# Patient Record
Sex: Female | Born: 1980 | Race: White | Hispanic: No | Marital: Married
Health system: Southern US, Community
[De-identification: ages and names within clinical notes are randomized; demographics above are authoritative.]

## PROBLEM LIST (undated history)

## (undated) ENCOUNTER — Emergency Department (HOSPITAL_COMMUNITY): Admission: EM | Payer: Managed Care, Other (non HMO) | Source: Home / Self Care

---

## 2019-06-15 ENCOUNTER — Other Ambulatory Visit: Payer: Self-pay

## 2019-06-15 DIAGNOSIS — Z20822 Contact with and (suspected) exposure to covid-19: Secondary | ICD-10-CM

## 2019-06-16 LAB — NOVEL CORONAVIRUS, NAA: SARS-CoV-2, NAA: NOT DETECTED

## 2020-05-11 DIAGNOSIS — F418 Other specified anxiety disorders: Secondary | ICD-10-CM | POA: Diagnosis not present

## 2020-05-11 DIAGNOSIS — E7801 Familial hypercholesterolemia: Secondary | ICD-10-CM | POA: Diagnosis not present

## 2020-07-05 DIAGNOSIS — J029 Acute pharyngitis, unspecified: Secondary | ICD-10-CM | POA: Diagnosis not present

## 2020-09-06 DIAGNOSIS — Z79899 Other long term (current) drug therapy: Secondary | ICD-10-CM | POA: Diagnosis not present

## 2020-09-06 DIAGNOSIS — M10072 Idiopathic gout, left ankle and foot: Secondary | ICD-10-CM | POA: Diagnosis not present

## 2020-09-06 DIAGNOSIS — E7801 Familial hypercholesterolemia: Secondary | ICD-10-CM | POA: Diagnosis not present

## 2020-11-03 DIAGNOSIS — R42 Dizziness and giddiness: Secondary | ICD-10-CM | POA: Diagnosis not present

## 2020-11-15 DIAGNOSIS — E7849 Other hyperlipidemia: Secondary | ICD-10-CM | POA: Diagnosis not present

## 2020-11-15 DIAGNOSIS — F3340 Major depressive disorder, recurrent, in remission, unspecified: Secondary | ICD-10-CM | POA: Diagnosis not present

## 2020-11-15 DIAGNOSIS — J018 Other acute sinusitis: Secondary | ICD-10-CM | POA: Diagnosis not present

## 2020-11-16 ENCOUNTER — Other Ambulatory Visit: Payer: Self-pay | Admitting: Internal Medicine

## 2020-11-16 DIAGNOSIS — E7849 Other hyperlipidemia: Secondary | ICD-10-CM | POA: Diagnosis not present

## 2020-11-16 DIAGNOSIS — Z0000 Encounter for general adult medical examination without abnormal findings: Secondary | ICD-10-CM | POA: Diagnosis not present

## 2020-11-16 DIAGNOSIS — R6889 Other general symptoms and signs: Secondary | ICD-10-CM | POA: Diagnosis not present

## 2020-11-16 DIAGNOSIS — Z13220 Encounter for screening for lipoid disorders: Secondary | ICD-10-CM | POA: Diagnosis not present

## 2020-11-16 DIAGNOSIS — Z131 Encounter for screening for diabetes mellitus: Secondary | ICD-10-CM | POA: Diagnosis not present

## 2020-11-16 DIAGNOSIS — Z20828 Contact with and (suspected) exposure to other viral communicable diseases: Secondary | ICD-10-CM | POA: Diagnosis not present

## 2020-11-16 DIAGNOSIS — Z20822 Contact with and (suspected) exposure to covid-19: Secondary | ICD-10-CM | POA: Diagnosis not present

## 2020-11-16 DIAGNOSIS — J019 Acute sinusitis, unspecified: Secondary | ICD-10-CM | POA: Diagnosis not present

## 2020-11-16 DIAGNOSIS — E559 Vitamin D deficiency, unspecified: Secondary | ICD-10-CM | POA: Diagnosis not present

## 2020-11-21 LAB — COMPLETE METABOLIC PANEL WITH GFR
AG Ratio: 1.9 (calc) (ref 1.0–2.5)
ALT: 17 U/L (ref 6–29)
AST: 19 U/L (ref 10–30)
Albumin: 4.3 g/dL (ref 3.6–5.1)
Alkaline phosphatase (APISO): 50 U/L (ref 31–125)
BUN: 18 mg/dL (ref 7–25)
CO2: 23 mmol/L (ref 20–32)
Calcium: 9.2 mg/dL (ref 8.6–10.2)
Chloride: 104 mmol/L (ref 98–110)
Creat: 0.53 mg/dL (ref 0.50–1.10)
GFR, Est African American: 138 mL/min/{1.73_m2} (ref >=60)
GFR, Est Non African American: 119 mL/min/{1.73_m2} (ref >=60)
Globulin: 2.3 g/dL (calc) (ref 1.9–3.7)
Glucose, Bld: 78 mg/dL (ref 65–99)
Potassium: 4.3 mmol/L (ref 3.5–5.3)
Sodium: 137 mmol/L (ref 135–146)
Total Bilirubin: 0.2 mg/dL (ref 0.2–1.2)
Total Protein: 6.6 g/dL (ref 6.1–8.1)

## 2020-11-21 LAB — CBC
HCT: 32.2 % — ABNORMAL LOW (ref 35.0–45.0)
Hemoglobin: 9.4 g/dL — ABNORMAL LOW (ref 11.7–15.5)
MCH: 20.8 pg — ABNORMAL LOW (ref 27.0–33.0)
MCHC: 29.2 g/dL — ABNORMAL LOW (ref 32.0–36.0)
MCV: 71.2 fL — ABNORMAL LOW (ref 80.0–100.0)
MPV: 10.6 fL (ref 7.5–12.5)
Platelets: 226 10*3/uL (ref 140–400)
RBC: 4.52 10*6/uL (ref 3.80–5.10)
RDW: 18.3 % — ABNORMAL HIGH (ref 11.0–15.0)
WBC: 5 10*3/uL (ref 3.8–10.8)

## 2020-11-21 LAB — LIPID PANEL
Cholesterol: 152 mg/dL (ref <200)
HDL: 59 mg/dL (ref >=50)
LDL Cholesterol (Calc): 73 mg/dL (calc)
Non-HDL Cholesterol (Calc): 93 mg/dL (calc) (ref <130)
Total CHOL/HDL Ratio: 2.6 (calc) (ref <5.0)
Triglycerides: 117 mg/dL (ref <150)

## 2020-11-21 LAB — IRON, TOTAL/TOTAL IRON BINDING CAP
%SAT: 4 % (calc) — ABNORMAL LOW (ref 16–45)
Iron: 18 ug/dL — ABNORMAL LOW (ref 40–190)
TIBC: 445 mcg/dL (calc) (ref 250–450)

## 2020-11-21 LAB — VITAMIN D 25 HYDROXY (VIT D DEFICIENCY, FRACTURES): Vit D, 25-Hydroxy: 61 ng/mL (ref 30–100)

## 2020-11-21 LAB — SARS-COV-2 RNA,(COVID-19) QUALITATIVE NAAT: SARS CoV2 RNA: DETECTED — AB

## 2020-11-21 LAB — B12 AND FOLATE PANEL
Folate: 16.9 ng/mL
Vitamin B-12: 1166 pg/mL — ABNORMAL HIGH (ref 200–1100)

## 2020-11-21 LAB — FERRITIN: Ferritin: 5 ng/mL — ABNORMAL LOW (ref 16–154)

## 2021-02-12 DIAGNOSIS — M654 Radial styloid tenosynovitis [de Quervain]: Secondary | ICD-10-CM | POA: Diagnosis not present

## 2021-02-18 DIAGNOSIS — R1013 Epigastric pain: Secondary | ICD-10-CM | POA: Diagnosis not present

## 2021-03-07 DIAGNOSIS — M654 Radial styloid tenosynovitis [de Quervain]: Secondary | ICD-10-CM | POA: Diagnosis not present

## 2021-04-30 ENCOUNTER — Ambulatory Visit (HOSPITAL_COMMUNITY)
Admission: EM | Admit: 2021-04-30 | Discharge: 2021-04-30 | Disposition: A | Discharge status: Home / Self Care | Payer: BC Managed Care – PPO | Attending: Urology | Admitting: Urology

## 2021-04-30 DIAGNOSIS — F410 Panic disorder [episodic paroxysmal anxiety] without agoraphobia: Secondary | ICD-10-CM | POA: Diagnosis not present

## 2021-04-30 MED ORDER — HYDROXYZINE HCL 10 MG PO TABS: 10.0000 mg | ORAL_TABLET | Freq: Three times a day (TID) | ORAL | 0 refills | Status: AC | PRN

## 2021-04-30 MED ORDER — HYDROXYZINE HCL 25 MG PO TABS
25.0000 mg | ORAL_TABLET | Freq: Once | ORAL | Status: AC
  Administered 2021-04-30: 25 mg via ORAL
  Filled 2021-04-30: qty 1

## 2021-04-30 NOTE — Progress Notes (Signed)
   04/30/21 0212  Patient Reported Information  How Did You Hear About [place]? Family/Friend  What Is the Reason for Your Visit/Call Today? Pt reports, having bad anxiety and is getting worse instead of getting better. Per pt, she was treated for anxiety over 18 years ago and has been okay. Pt reports, she doesn't know why it's coming up now. Pt reports, she can't sleep at night she will have bad dreams wake up and when she goes back to sleep the bad dream continues. Pt reports having 2-3 panic attacks per month. Pt discussed she went to the dentist and was shaking so bad the dentist wanted to stop and give her tempoary filling but she encourage the dentist to continue. Pt reports, she wants to be happy. Pt reports she can contact for safety. Pt's husband reports, he feels the pt will be safe if discharged. Outpatient resources provided.  How Long Has This Been Causing You Problems? 1-6 months  What Do You Feel Would Help You the Most Today? Medication(s) (Anixety.)  Have You Recently Had Any Thoughts About Hurting Yourself? No  Are You Planning to Commit Suicide/Harm Yourself At This time? No  Have you Recently Had Thoughts About Hurting Someone Karolee Ohs? No  Are You Planning To Harm Someone At This Time? No  Have You Used Any Alcohol or Drugs in the Past 24 Hours? No  Do You Currently Have a Therapist/Psychiatrist? No  [facility] Screening Triage Referral Assessment  Type of Contact Face-to-Face  Location of Assessment [facility] Assessment Services  Provider location [facility] Assessment Services  Collateral Involvement Josh Loux, husband, [phone].  Does Patient Have a Automotive engineer Guardian? No  Patient Determined To Be At Risk for Harm To Self or Others Based on Review of Patient Reported Information or Presenting Complaint? No  Does Patient Present under Involuntary Commitment? No  [place] of Residence [place]  Patient Currently Receiving the Following Services: Not Receiving Services   Determination of Need Routine (7 days)  Options For Referral Medication Management;Outpatient Therapy    Determination of need: Routine.    Redmond Pulling, MS, [facility], [facility] Triage Specialist [phone]

## 2021-04-30 NOTE — Discharge Instructions (Signed)

## 2021-04-30 NOTE — ED Provider Notes (Signed)
Behavioral Health Urgent Care Medical Screening Exam  Patient Name: Diane Owens MRN: [id number] Date of Evaluation: 04/30/21 Chief Complaint:   Diagnosis:  Final diagnoses:  Panic disorder    History of Present illness: Diane Owens is a 40 y.o. female with a self-reported psychiatric history of depression and anxiety.  Patient presented voluntarily to [facility] with chief complaint of worsening anxiety and panic attacks.  Patient is accompanied by her husband [name].  Patient consented to her husband remaining present in the room during this assessment.  Patient is assessed face-to-face and her chart was reviewed by this provider.  On approach, patient is tearful, alert, and oriented x4.  She is speaking in normal tone of voice at moderate rate and pace with good eye contact.  Patient reports her mood as "very anxious and panicky."  Patient thought process is coherent.  Patient reports that she has been experiencing worsening anxiety with frequent panic attacks over the past 1 year. She says her symptoms has worsened in the past few weeks and that it is affecting her sleep and her ability to function. She states that she has nightmares and wakes up several times throughout the night. she reports palpitation, chest tightness, and visual troubles during panic attacks that resolves on its own after a panic attack.   She reports that she is currently prescribed Prozac 40 mg/day for anxiety and depression.  She denies depressive symptoms currently.  She is unable to identify triggers for anxiety however she reports that she has a history of trauma.  She is evasive about discussing trauma and reports that she will like to be connected with outpatient psychiatric resources to discuss trauma.   Patient denies SI/HI/paranoia/hallucination/alcohol and illicit substance use.  Patient contracted verbally for safety with this Clinical research associate.  Patient's husband reports that he has no safety concern.   Psychiatric  Specialty Exam  Presentation  General Appearance:Appropriate for Environment  Eye Contact:Good  Speech:Clear and Coherent  Speech Volume:Normal  Handedness:Right   Mood and Affect  Mood:Anxious  Affect:Congruent   Thought Process  Thought Processes:Coherent  Descriptions of Associations:Intact  Orientation:Full (Time, Place and Person)  Thought Content:WDL    Hallucinations:None  Ideas of Reference:None  Suicidal Thoughts:No data recorded Homicidal Thoughts:No   Sensorium  Memory:Immediate Good; Recent Good; Remote Good  Judgment:Fair  Insight:Good   Executive Functions  Concentration:Good  Attention Span:Good  Recall:Good  Fund of Knowledge:Good  Language:Good   Psychomotor Activity  Psychomotor Activity:Normal   Assets  Assets:Communication Skills; Desire for Improvement; Financial Resources/Insurance; Housing; Physical Health; Social Support; Intimacy; Transportation; Vocational/Educational   Sleep  Sleep:Poor  Number of hours: 2   No data recorded  Physical Exam: Physical Exam Vitals and nursing note reviewed.  Constitutional:      General: She is not in acute distress.    Appearance: She is well-developed.  HENT:     Head: Normocephalic and atraumatic.  Eyes:     Conjunctiva/sclera: Conjunctivae normal.  Cardiovascular:     Rate and Rhythm: Normal rate.  Pulmonary:     Effort: Pulmonary effort is normal. No respiratory distress.     Breath sounds: Normal breath sounds.  Abdominal:     Palpations: Abdomen is soft.     Tenderness: There is no abdominal tenderness.  Musculoskeletal:     Cervical back: Neck supple.  Skin:    General: Skin is warm and dry.  Neurological:     Mental Status: She is alert and oriented to person, place, and time.  Review of Systems  Constitutional: Negative.   HENT: Negative.    Eyes: Negative.   Respiratory: Negative.    Cardiovascular: Negative.   Gastrointestinal: Negative.    Genitourinary: Negative.   Musculoskeletal: Negative.   Skin: Negative.   Neurological: Negative.   Endo/Heme/Allergies: Negative.   Psychiatric/Behavioral:  Negative for depression, hallucinations, substance abuse and suicidal ideas. The patient is nervous/anxious.   Blood pressure 98/71, pulse 70, temperature 98.3 F (36.8 C), temperature source Oral, resp. rate 16, SpO2 98 %. There is no height or weight on file to calculate BMI.  Musculoskeletal: Strength & Muscle Tone: within normal limits Gait & Station: normal Patient leans: Right   [facility] MSE Discharge Disposition for Follow up and Recommendations: Based on my evaluation the patient does not appear to have an emergency medical condition and can be discharged with resources and follow up care in outpatient services for Medication Management and Individual Therapy  -New prescription for hydroxyzine 25 mg as needed 3 times daily.  Outpatient psychiatric resources/counseling services provided to the patient. -Patient instructed to call 911 emergency services, return to [facility] or go to nearest ED if symptoms worsens.   Maricela Bo, NP 04/30/2021, 2:16 AM

## 2021-05-01 ENCOUNTER — Telehealth (HOSPITAL_COMMUNITY): Payer: Self-pay | Admitting: Internal Medicine

## 2021-05-01 NOTE — BH Assessment (Signed)
Care Management - Follow Up [facility] Discharges   Writer made contact with the patient. Patient reports that he has a follow up appointment at a mental health facility on 05-24-21.

## 2021-05-03 DIAGNOSIS — F410 Panic disorder [episodic paroxysmal anxiety] without agoraphobia: Secondary | ICD-10-CM | POA: Diagnosis not present

## 2021-05-03 DIAGNOSIS — F419 Anxiety disorder, unspecified: Secondary | ICD-10-CM | POA: Diagnosis not present

## 2021-06-06 DIAGNOSIS — F4310 Post-traumatic stress disorder, unspecified: Secondary | ICD-10-CM | POA: Diagnosis not present

## 2021-06-08 DIAGNOSIS — J101 Influenza due to other identified influenza virus with other respiratory manifestations: Secondary | ICD-10-CM | POA: Diagnosis not present

## 2021-06-08 DIAGNOSIS — A0839 Other viral enteritis: Secondary | ICD-10-CM | POA: Diagnosis not present

## 2021-06-12 DIAGNOSIS — F4310 Post-traumatic stress disorder, unspecified: Secondary | ICD-10-CM | POA: Diagnosis not present

## 2021-06-12 DIAGNOSIS — F332 Major depressive disorder, recurrent severe without psychotic features: Secondary | ICD-10-CM | POA: Diagnosis not present

## 2021-06-12 DIAGNOSIS — F410 Panic disorder [episodic paroxysmal anxiety] without agoraphobia: Secondary | ICD-10-CM | POA: Diagnosis not present

## 2021-06-12 DIAGNOSIS — F4312 Post-traumatic stress disorder, chronic: Secondary | ICD-10-CM | POA: Diagnosis not present

## 2021-06-28 DIAGNOSIS — F4310 Post-traumatic stress disorder, unspecified: Secondary | ICD-10-CM | POA: Diagnosis not present

## 2021-07-11 DIAGNOSIS — F4310 Post-traumatic stress disorder, unspecified: Secondary | ICD-10-CM | POA: Diagnosis not present

## 2021-07-19 DIAGNOSIS — F410 Panic disorder [episodic paroxysmal anxiety] without agoraphobia: Secondary | ICD-10-CM | POA: Diagnosis not present

## 2021-08-08 DIAGNOSIS — F4310 Post-traumatic stress disorder, unspecified: Secondary | ICD-10-CM | POA: Diagnosis not present

## 2021-08-14 DIAGNOSIS — F332 Major depressive disorder, recurrent severe without psychotic features: Secondary | ICD-10-CM | POA: Diagnosis not present

## 2021-08-28 DIAGNOSIS — J029 Acute pharyngitis, unspecified: Secondary | ICD-10-CM | POA: Diagnosis not present

## 2021-09-05 DIAGNOSIS — F4310 Post-traumatic stress disorder, unspecified: Secondary | ICD-10-CM | POA: Diagnosis not present

## 2021-10-03 DIAGNOSIS — F4310 Post-traumatic stress disorder, unspecified: Secondary | ICD-10-CM | POA: Diagnosis not present

## 2021-11-23 ENCOUNTER — Emergency Department (HOSPITAL_COMMUNITY): Payer: Managed Care, Other (non HMO)

## 2021-11-23 ENCOUNTER — Encounter (HOSPITAL_COMMUNITY): Payer: Self-pay

## 2021-11-23 ENCOUNTER — Other Ambulatory Visit: Payer: Self-pay

## 2021-11-23 ENCOUNTER — Emergency Department (HOSPITAL_COMMUNITY)
Admission: EM | Admit: 2021-11-23 | Discharge: 2021-11-23 | Disposition: A | Discharge status: Home / Self Care | Payer: Managed Care, Other (non HMO) | Attending: Emergency Medicine | Admitting: Emergency Medicine

## 2021-11-23 DIAGNOSIS — N9489 Other specified conditions associated with female genital organs and menstrual cycle: Secondary | ICD-10-CM | POA: Diagnosis not present

## 2021-11-23 DIAGNOSIS — D649 Anemia, unspecified: Secondary | ICD-10-CM | POA: Diagnosis not present

## 2021-11-23 DIAGNOSIS — R1084 Generalized abdominal pain: Secondary | ICD-10-CM | POA: Diagnosis present

## 2021-11-23 DIAGNOSIS — K8020 Calculus of gallbladder without cholecystitis without obstruction: Secondary | ICD-10-CM | POA: Diagnosis not present

## 2021-11-23 LAB — CBC WITH DIFFERENTIAL/PLATELET
Abs Immature Granulocytes: 0.01 10*3/uL (ref 0.00–0.07)
Basophils Absolute: 0.1 10*3/uL (ref 0.0–0.1)
Basophils Relative: 1 %
Eosinophils Absolute: 0.2 10*3/uL (ref 0.0–0.5)
Eosinophils Relative: 3 %
HCT: 29.3 % — ABNORMAL LOW (ref 36.0–46.0)
Hemoglobin: 8.2 g/dL — ABNORMAL LOW (ref 12.0–15.0)
Immature Granulocytes: 0 %
Lymphocytes Relative: 28 %
Lymphs Abs: 1.7 10*3/uL (ref 0.7–4.0)
MCH: 19.1 pg — ABNORMAL LOW (ref 26.0–34.0)
MCHC: 28 g/dL — ABNORMAL LOW (ref 30.0–36.0)
MCV: 68.3 fL — ABNORMAL LOW (ref 80.0–100.0)
Monocytes Absolute: 0.5 10*3/uL (ref 0.1–1.0)
Monocytes Relative: 9 %
Neutro Abs: 3.5 10*3/uL (ref 1.7–7.7)
Neutrophils Relative %: 59 %
Platelets: 287 10*3/uL (ref 150–400)
RBC: 4.29 MIL/uL (ref 3.87–5.11)
RDW: 18.4 % — ABNORMAL HIGH (ref 11.5–15.5)
WBC: 5.9 10*3/uL (ref 4.0–10.5)
nRBC: 0 % (ref 0.0–0.2)

## 2021-11-23 LAB — COMPREHENSIVE METABOLIC PANEL
ALT: 15 U/L (ref 0–44)
AST: 15 U/L (ref 15–41)
Albumin: 4.1 g/dL (ref 3.5–5.0)
Alkaline Phosphatase: 42 U/L (ref 38–126)
Anion gap: 6 (ref 5–15)
BUN: 22 mg/dL — ABNORMAL HIGH (ref 6–20)
CO2: 20 mmol/L — ABNORMAL LOW (ref 22–32)
Calcium: 8.9 mg/dL (ref 8.9–10.3)
Chloride: 112 mmol/L — ABNORMAL HIGH (ref 98–111)
Creatinine, Ser: 0.54 mg/dL (ref 0.44–1.00)
GFR, Estimated: >60 mL/min (ref >60)
Glucose, Bld: 95 mg/dL (ref 70–99)
Potassium: 4.3 mmol/L (ref 3.5–5.1)
Sodium: 138 mmol/L (ref 135–145)
Total Bilirubin: 0.4 mg/dL (ref 0.3–1.2)
Total Protein: 7.2 g/dL (ref 6.5–8.1)

## 2021-11-23 LAB — I-STAT BETA HCG BLOOD, ED (MC, WL, AP ONLY): I-stat hCG, quantitative: <5 m[IU]/mL (ref <5)

## 2021-11-23 LAB — URINALYSIS, ROUTINE W REFLEX MICROSCOPIC
Bilirubin Urine: NEGATIVE
Glucose, UA: NEGATIVE mg/dL
Hgb urine dipstick: NEGATIVE
Ketones, ur: NEGATIVE mg/dL
Leukocytes,Ua: NEGATIVE
Nitrite: NEGATIVE
Protein, ur: NEGATIVE mg/dL
Specific Gravity, Urine: 1.01 (ref 1.005–1.030)
pH: 5 (ref 5.0–8.0)

## 2021-11-23 LAB — POC OCCULT BLOOD, ED: Fecal Occult Bld: NEGATIVE

## 2021-11-23 LAB — LIPASE, BLOOD: Lipase: 40 U/L (ref 11–51)

## 2021-11-23 MED ORDER — ONDANSETRON 4 MG PO TBDP
4.0000 mg | ORAL_TABLET | Freq: Once | ORAL | Status: AC
  Administered 2021-11-23: 4 mg via ORAL
  Filled 2021-11-23: qty 1

## 2021-11-23 MED ORDER — ONDANSETRON HCL 4 MG/2ML IJ SOLN
4.0000 mg | Freq: Once | INTRAMUSCULAR | Status: AC
Start: 2021-11-23 — End: 2021-11-23
  Administered 2021-11-23: 4 mg via INTRAVENOUS
  Filled 2021-11-23: qty 2

## 2021-11-23 MED ORDER — IOHEXOL 300 MG/ML  SOLN
100.0000 mL | Freq: Once | INTRAMUSCULAR | Status: AC | PRN
  Administered 2021-11-23: 100 mL via INTRAVENOUS

## 2021-11-23 MED ORDER — MORPHINE SULFATE (PF) 4 MG/ML IV SOLN
4.0000 mg | Freq: Once | INTRAVENOUS | Status: AC
  Administered 2021-11-23: 4 mg via INTRAVENOUS
  Filled 2021-11-23: qty 1

## 2021-11-23 MED ORDER — OXYCODONE-ACETAMINOPHEN 5-325 MG PO TABS
1.0000 | ORAL_TABLET | Freq: Once | ORAL | Status: AC
  Administered 2021-11-23: 1 via ORAL
  Filled 2021-11-23: qty 1

## 2021-11-23 MED ORDER — ONDANSETRON 4 MG PO TBDP: 4.0000 mg | ORAL_TABLET | Freq: Three times a day (TID) | ORAL | 0 refills | Status: AC | PRN

## 2021-11-23 MED ORDER — OXYCODONE-ACETAMINOPHEN 5-325 MG PO TABS: 1.0000 | ORAL_TABLET | Freq: Four times a day (QID) | ORAL | 0 refills | Status: DC | PRN

## 2021-11-23 MED ORDER — SODIUM CHLORIDE 0.9 % IV BOLUS
1000.0000 mL | Freq: Once | INTRAVENOUS | Status: AC
  Administered 2021-11-23: 1000 mL via INTRAVENOUS

## 2021-11-23 MED ORDER — SODIUM CHLORIDE (PF) 0.9 % IJ SOLN
INTRAMUSCULAR | Status: AC
  Filled 2021-11-23: qty 50

## 2021-11-23 NOTE — ED Provider Notes (Signed)
[facility]-EMERGENCY DEPT Provider Note   CSN: [id number] Arrival date & time: 11/23/21  0033     History  Chief Complaint  Patient presents with   Abdominal Pain    Diane Owens is a 41 y.o. female with PMHx IBS-C who presents to the ED today with complaint of gradual onset, constant, worsening, diffuse, abdominal pain that began yesterday. Pt also complains of nausea and obstipation. She does not take any medications for IBS-C. She reports she was prescribed a medication in the past that she believes was Linzess however had severe diarrhea afterwards and decided to stop taking it. She reports she typically treats her symptoms with holistic measures however they have not been working for her. She reports this pain feels different due to inability to ease it with her typical measures. She states she had a very small amount of stringy stool yesterday morning however since than has not had a BM and is not passing gas. Pt reports multiple abdominal surgeries in the past - she is unsure if she has had her appendic taken out but states its a possibility. She also reports pelvic mesh and exploratory surgery after "twisting" in her bowels.  The history is provided by the patient and medical records.      Home Medications Prior to Admission medications   Medication Sig Start Date End Date Taking? Authorizing Provider  ondansetron (ZOFRAN-ODT) 4 MG disintegrating tablet Take 1 tablet (4 mg total) by mouth every 8 (eight) hours as needed for nausea or vomiting. 11/23/21  Yes Hyman Hopes, Evone Arseneau, PA-C  oxyCODONE-acetaminophen (PERCOCET/ROXICET) 5-325 MG tablet Take 1 tablet by mouth every 6 (six) hours as needed for severe pain. 11/23/21  Yes Amiylah Anastos, PA-C  FLUoxetine (PROZAC) 40 MG capsule Take 40 mg by mouth daily.    [provider]  hydrOXYzine (ATARAX/VISTARIL) 10 MG tablet Take 1 tablet (10 mg total) by mouth 3 (three) times daily as needed for anxiety. 04/30/21    Maricela Bo, NP      Allergies    Patient has no allergy information on record.    Review of Systems   Review of Systems  Constitutional:  Negative for chills and fever.  Respiratory:  Negative for shortness of breath.   Cardiovascular:  Negative for chest pain.  Gastrointestinal:  Positive for abdominal pain, constipation and nausea.  All other systems reviewed and are negative.  Physical Exam Updated Vital Signs BP 106/66   Pulse 74   Temp 98.2 F (36.8 C) (Oral)   Resp 18   Ht 5' (1.524 m)   Wt 57.6 kg   SpO2 98%   BMI 24.80 kg/m  Physical Exam Vitals and nursing note reviewed.  Constitutional:      Appearance: She is not ill-appearing or diaphoretic.  HENT:     Head: Normocephalic and atraumatic.  Eyes:     Conjunctiva/sclera: Conjunctivae normal.  Cardiovascular:     Rate and Rhythm: Normal rate and regular rhythm.     Heart sounds: Normal heart sounds.  Pulmonary:     Effort: Pulmonary effort is normal.     Breath sounds: Normal breath sounds. No wheezing, rhonchi or rales.  Abdominal:     General: Abdomen is flat. Bowel sounds are increased.     Palpations: Abdomen is soft.     Tenderness: There is generalized abdominal tenderness. There is no guarding or rebound.  Genitourinary:    Comments: Chaperone present for GU exam. Light brown stool on gloved finger;  guaiac negative Musculoskeletal:     Cervical back: Neck supple.  Skin:    General: Skin is warm and dry.  Neurological:     Mental Status: She is alert.    ED Results / Procedures / Treatments   Labs (all labs ordered are listed, but only abnormal results are displayed) Labs Reviewed  COMPREHENSIVE METABOLIC PANEL - Abnormal; Notable for the following components:      Result Value   Chloride 112 (*)    CO2 20 (*)    BUN 22 (*)    All other components within normal limits  CBC WITH DIFFERENTIAL/PLATELET - Abnormal; Notable for the following components:   Hemoglobin 8.2 (*)    HCT 29.3  (*)    MCV 68.3 (*)    MCH 19.1 (*)    MCHC 28.0 (*)    RDW 18.4 (*)    All other components within normal limits  URINALYSIS, ROUTINE W REFLEX MICROSCOPIC - Abnormal; Notable for the following components:   Color, Urine STRAW (*)    All other components within normal limits  LIPASE, BLOOD  I-STAT BETA HCG BLOOD, ED ([facility], [facility], [facility] ONLY)  POC OCCULT BLOOD, ED    EKG None  Radiology CT Abdomen Pelvis W Contrast  Result Date: 11/23/2021 CLINICAL DATA:  41 year old female with history of abdominal pain. Suspected bowel obstruction. EXAM: CT ABDOMEN AND PELVIS WITH CONTRAST TECHNIQUE: Multidetector CT imaging of the abdomen and pelvis was performed using the standard protocol following bolus administration of intravenous contrast. RADIATION DOSE REDUCTION: This exam was performed according to the departmental dose-optimization program which includes automated exposure control, adjustment of the mA and/or kV according to patient size and/or use of iterative reconstruction technique. CONTRAST:  OMNIPAQUE IOHEXOL 300 MG/ML  SOLN COMPARISON:  No priors. FINDINGS: Lower chest: Unremarkable. Hepatobiliary: No suspicious cystic or solid hepatic lesions. No intra or extrahepatic biliary ductal dilatation. Small calcified gallstones lie dependently near the neck of the gallbladder. Gallbladder is only mildly distended. Gallbladder wall thickness is normal. No pericholecystic fluid or surrounding inflammatory changes. Pancreas: No pancreatic mass. No pancreatic ductal dilatation. No pancreatic or peripancreatic fluid collections or inflammatory changes. Spleen: Unremarkable. Adrenals/Urinary Tract: Bilateral kidneys and adrenal glands are normal in appearance. No hydroureteronephrosis. Urinary bladder is normal in appearance. Stomach/Bowel: The appearance of the stomach is normal. There is no pathologic dilatation of small bowel or colon. The appendix is not confidently identified and may be surgically  absent. Regardless, there are no inflammatory changes noted adjacent to the cecum to suggest the presence of an acute appendicitis at this time. Vascular/Lymphatic: No significant atherosclerotic disease, aneurysm or dissection noted in the abdominal or pelvic vasculature. No lymphadenopathy noted in the abdomen or pelvis. Reproductive: Uterus is heterogeneous in appearance with several small lesions, largest of which is in the posterior aspect of the uterine fundus measuring approximately 2.4 cm in diameter, presumably multiple small fibroids. Right ovary is unremarkable in appearance. In the left adnexa there is a 2.4 cm centrally low-attenuation peripherally rim enhancing structure, most compatible with a degenerating corpus luteum cyst. Other: No significant volume of ascites.  No pneumoperitoneum. Musculoskeletal: There are no aggressive appearing lytic or blastic lesions noted in the visualized portions of the skeleton. IMPRESSION: 1. Cholelithiasis without evidence of acute cholecystitis at this time. 2. Probable degenerating corpus luteum cyst in the left ovary. 3. No other potential acute findings are noted on today's examination to account for the patient's symptoms. Electronically Signed   By: Reuel Boom  Entrikin M.D.   On: 11/23/2021 05:16    Procedures Procedures    Medications Ordered in ED Medications  oxyCODONE-acetaminophen (PERCOCET/ROXICET) 5-325 MG per tablet 1 tablet (has no administration in time range)  ondansetron (ZOFRAN-ODT) disintegrating tablet 4 mg (has no administration in time range)  sodium chloride 0.9 % bolus 1,000 mL (0 mLs Intravenous Stopped 11/23/21 0458)  ondansetron (ZOFRAN) injection 4 mg (4 mg Intravenous Given 11/23/21 0335)  morphine (PF) 4 MG/ML injection 4 mg (4 mg Intravenous Given 11/23/21 0334)  iohexol (OMNIPAQUE) 300 MG/ML solution 100 mL (100 mLs Intravenous Contrast Given 11/23/21 0448)  sodium chloride (PF) 0.9 % injection (  Given by Other 11/23/21 0413)     ED Course/ Medical Decision Making/ A&P                           Medical Decision Making 41 year old female who presents to the ED today with complaint of diffuse abdominal pain, nausea, constipation/obstipation since yesterday.  History of IBS-C however states that this feels different.  On arrival to the ED today vitals are stable.  She was medically screened and work-up started including CBC, CMP, lipase, beta-hCG.  CBC without leukocytosis.  Hemoglobin of 8.2.  Most recently 9.3 approximately 1 year ago.  She does report history of anemia and states that she is supposed to be on iron supplementation however is noncompliant due to it causing worsening constipation.  Denies any melena or bright red blood per rectum however we will plan for fecal occult testing. CMP with slightly elevated chloride 112, bicarb 20.  No other electrolyte abnormalities. Lfts unremarkable.  Lipase within normal limits. Beta-hCG negative.  History of tubal ligation.   On exam she is noted to have diffuse abdominal tenderness palpation with increased bowel sounds.  We will plan for fecal occult testing however we will also plan for CT scan given complaint of not passing gas.  She has had multiple surgeries of her stomach however does not seem very knowledgeable in her past medical care and is unable to tell me what specific surgery she has had.   Chaperone present for GU exam - Guaiac negative  CT scan: IMPRESSION:  1. Cholelithiasis without evidence of acute cholecystitis at this  time.  2. Probable degenerating corpus luteum cyst in the left ovary.  3. No other potential acute findings are noted on today's  examination to account for the patient's symptoms.   On reevaluation pt resting comfortably. Reports improvement in her symptoms. Labs not suggestive of acute chole at this time. Do not feel pt requires ultrasound at this time. Will have her follow up with [facility]. Have discussed dietary  modifications with her. Strict return precautions have been discussed including worsening pain, fevers > 100.4, excessive vomiting, jaundice. She is in agreement with plan. Stable for discharge home. Discharged with short course of percocet and zofran.   Problems Addressed: Anemia, unspecified type: chronic illness or injury Calculus of gallbladder without cholecystitis without obstruction: acute illness or injury  Amount and/or Complexity of Data Reviewed Labs: ordered. Radiology: ordered.  Risk Prescription drug management.          Final Clinical Impression(s) / ED Diagnoses Final diagnoses:  Calculus of gallbladder without cholecystitis without obstruction  Anemia, unspecified type    Rx / DC Orders ED Discharge Orders          Ordered    oxyCODONE-acetaminophen (PERCOCET/ROXICET) 5-325 MG tablet  Every 6 hours  PRN        11/23/21 0555    ondansetron (ZOFRAN-ODT) 4 MG disintegrating tablet  Every 8 hours PRN        11/23/21 0555             Discharge Instructions      Please pick up medications and take as prescribed.   [facility] for your gall stones. Attached is information regarding same and food choices to help prevent flare ups of your pain.   Return to the ED IMMEDIATELY for any new/worsening symptoms including worsening pain, excessive vomiting, fevers > 100.4, skin/eyes turning yellow, or any other new/concerning symptoms.          Tanda RockersVenter, Araceli Arango, PA-C 11/23/21 [id number]    Geoffery Lyonselo, [place], MD 11/24/21 [phone]

## 2021-11-23 NOTE — ED Triage Notes (Signed)
Pt reports with lower abdominal pain x 2 days. Pt reports having a hx of IBS.

## 2021-11-23 NOTE — Discharge Instructions (Addendum)
Please pick up medications and take as prescribed.   Follow up with [facility] for your gall stones. Attached is information regarding same and food choices to help prevent flare ups of your pain.   Return to the ED IMMEDIATELY for any new/worsening symptoms including worsening pain, excessive vomiting, fevers > 100.4, skin/eyes turning yellow, or any other new/concerning symptoms.

## 2021-11-23 NOTE — ED Notes (Signed)
I provided reinforced discharge education based off of discharge instructions. Pt acknowledged and understood my education. Pt had no further questions/concerns for provider/myself.  °

## 2021-11-23 NOTE — ED Notes (Signed)
Pt ambulatory without assistance.  

## 2021-11-23 NOTE — ED Notes (Signed)
Pt advises being unable to provide urine sample at this time, will monitor. 

## 2021-12-03 ENCOUNTER — Ambulatory Visit (HOSPITAL_BASED_OUTPATIENT_CLINIC_OR_DEPARTMENT_OTHER)
Admission: EM | Admit: 2021-12-03 | Discharge: 2021-12-03 | Disposition: A | Discharge status: Home / Self Care | Payer: Managed Care, Other (non HMO) | Attending: Emergency Medicine | Admitting: Emergency Medicine

## 2021-12-03 ENCOUNTER — Encounter (HOSPITAL_COMMUNITY)
Admission: EM | Disposition: A | Discharge status: Home / Self Care | Payer: Self-pay | Source: Home / Self Care | Attending: Emergency Medicine

## 2021-12-03 ENCOUNTER — Encounter (HOSPITAL_BASED_OUTPATIENT_CLINIC_OR_DEPARTMENT_OTHER): Payer: Self-pay | Admitting: Emergency Medicine

## 2021-12-03 ENCOUNTER — Other Ambulatory Visit: Payer: Self-pay

## 2021-12-03 ENCOUNTER — Emergency Department (EMERGENCY_DEPARTMENT_HOSPITAL): Payer: Managed Care, Other (non HMO) | Admitting: Anesthesiology

## 2021-12-03 ENCOUNTER — Emergency Department (HOSPITAL_BASED_OUTPATIENT_CLINIC_OR_DEPARTMENT_OTHER): Payer: Managed Care, Other (non HMO)

## 2021-12-03 ENCOUNTER — Emergency Department (HOSPITAL_COMMUNITY): Payer: Managed Care, Other (non HMO) | Admitting: Anesthesiology

## 2021-12-03 DIAGNOSIS — R197 Diarrhea, unspecified: Secondary | ICD-10-CM | POA: Diagnosis not present

## 2021-12-03 DIAGNOSIS — R1011 Right upper quadrant pain: Secondary | ICD-10-CM | POA: Diagnosis present

## 2021-12-03 DIAGNOSIS — R6883 Chills (without fever): Secondary | ICD-10-CM | POA: Diagnosis not present

## 2021-12-03 DIAGNOSIS — D649 Anemia, unspecified: Secondary | ICD-10-CM | POA: Diagnosis not present

## 2021-12-03 DIAGNOSIS — R61 Generalized hyperhidrosis: Secondary | ICD-10-CM | POA: Insufficient documentation

## 2021-12-03 DIAGNOSIS — E872 Acidosis, unspecified: Secondary | ICD-10-CM | POA: Insufficient documentation

## 2021-12-03 DIAGNOSIS — K810 Acute cholecystitis: Secondary | ICD-10-CM

## 2021-12-03 DIAGNOSIS — K8010 Calculus of gallbladder with chronic cholecystitis without obstruction: Secondary | ICD-10-CM | POA: Insufficient documentation

## 2021-12-03 HISTORY — PX: CHOLECYSTECTOMY: SHX55

## 2021-12-03 LAB — CBC
HCT: 28.2 % — ABNORMAL LOW (ref 36.0–46.0)
Hemoglobin: 8 g/dL — ABNORMAL LOW (ref 12.0–15.0)
MCH: 19.5 pg — ABNORMAL LOW (ref 26.0–34.0)
MCHC: 28.4 g/dL — ABNORMAL LOW (ref 30.0–36.0)
MCV: 68.6 fL — ABNORMAL LOW (ref 80.0–100.0)
Platelets: 284 10*3/uL (ref 150–400)
RBC: 4.11 MIL/uL (ref 3.87–5.11)
RDW: 21.1 % — ABNORMAL HIGH (ref 11.5–15.5)
WBC: 6.2 10*3/uL (ref 4.0–10.5)
nRBC: 0 % (ref 0.0–0.2)

## 2021-12-03 LAB — URINALYSIS, ROUTINE W REFLEX MICROSCOPIC
Bilirubin Urine: NEGATIVE
Glucose, UA: NEGATIVE mg/dL
Ketones, ur: NEGATIVE mg/dL
Leukocytes,Ua: NEGATIVE
Nitrite: NEGATIVE
Protein, ur: 30 mg/dL — AB
RBC / HPF: >50 RBC/hpf — ABNORMAL HIGH (ref 0–5)
Specific Gravity, Urine: <1.005 — ABNORMAL LOW (ref 1.005–1.030)
pH: 7 (ref 5.0–8.0)

## 2021-12-03 LAB — COMPREHENSIVE METABOLIC PANEL
ALT: 15 U/L (ref 0–44)
AST: 16 U/L (ref 15–41)
Albumin: 4.2 g/dL (ref 3.5–5.0)
Alkaline Phosphatase: 45 U/L (ref 38–126)
Anion gap: 9 (ref 5–15)
BUN: 10 mg/dL (ref 6–20)
CO2: 20 mmol/L — ABNORMAL LOW (ref 22–32)
Calcium: 8.5 mg/dL — ABNORMAL LOW (ref 8.9–10.3)
Chloride: 111 mmol/L (ref 98–111)
Creatinine, Ser: 0.5 mg/dL (ref 0.44–1.00)
GFR, Estimated: >60 mL/min (ref >60)
Glucose, Bld: 110 mg/dL — ABNORMAL HIGH (ref 70–99)
Potassium: 3.9 mmol/L (ref 3.5–5.1)
Sodium: 140 mmol/L (ref 135–145)
Total Bilirubin: 0.2 mg/dL — ABNORMAL LOW (ref 0.3–1.2)
Total Protein: 7 g/dL (ref 6.5–8.1)

## 2021-12-03 LAB — PREGNANCY, URINE: Preg Test, Ur: NEGATIVE

## 2021-12-03 LAB — LIPASE, BLOOD: Lipase: 15 U/L (ref 11–51)

## 2021-12-03 SURGERY — LAPAROSCOPIC CHOLECYSTECTOMY
Anesthesia: General

## 2021-12-03 MED ORDER — ACETAMINOPHEN 500 MG PO TABS: 1000.0000 mg | ORAL_TABLET | Freq: Four times a day (QID) | ORAL | 3 refills | Status: DC | PRN

## 2021-12-03 MED ORDER — ACETAMINOPHEN 500 MG PO TABS: 1000.0000 mg | ORAL_TABLET | Freq: Four times a day (QID) | ORAL | 3 refills | Status: AC

## 2021-12-03 MED ORDER — MORPHINE SULFATE (PF) 4 MG/ML IV SOLN
4.0000 mg | Freq: Once | INTRAVENOUS | Status: AC
  Administered 2021-12-03: 4 mg via INTRAVENOUS
  Filled 2021-12-03: qty 1

## 2021-12-03 MED ORDER — FENTANYL CITRATE (PF) 250 MCG/5ML IJ SOLN
INTRAMUSCULAR | Status: DC | PRN
Start: 2021-12-03 — End: 2021-12-03
  Administered 2021-12-03: 100 ug via INTRAVENOUS
  Administered 2021-12-03 (×2): 50 ug via INTRAVENOUS

## 2021-12-03 MED ORDER — LACTATED RINGERS IV SOLN: INTRAVENOUS | Status: DC | PRN

## 2021-12-03 MED ORDER — HYDROMORPHONE HCL 1 MG/ML IJ SOLN
1.0000 mg | Freq: Once | INTRAMUSCULAR | Status: AC
  Administered 2021-12-03: 1 mg via INTRAVENOUS
  Filled 2021-12-03: qty 1

## 2021-12-03 MED ORDER — DEXAMETHASONE SODIUM PHOSPHATE 10 MG/ML IJ SOLN
INTRAMUSCULAR | Status: DC | PRN
  Administered 2021-12-03: 10 mg via INTRAVENOUS

## 2021-12-03 MED ORDER — OXYCODONE HCL 5 MG PO TABS
5.0000 mg | ORAL_TABLET | Freq: Once | ORAL | Status: AC | PRN
  Administered 2021-12-03: 5 mg via ORAL

## 2021-12-03 MED ORDER — MIDAZOLAM HCL 2 MG/2ML IJ SOLN
INTRAMUSCULAR | Status: DC | PRN
  Administered 2021-12-03: 2 mg via INTRAVENOUS

## 2021-12-03 MED ORDER — KETOROLAC TROMETHAMINE 30 MG/ML IJ SOLN: 30.0000 mg | Freq: Once | INTRAMUSCULAR | Status: DC | PRN

## 2021-12-03 MED ORDER — HYDROMORPHONE HCL 1 MG/ML IJ SOLN
0.2500 mg | INTRAMUSCULAR | Status: DC | PRN
  Administered 2021-12-03 (×2): 0.5 mg via INTRAVENOUS

## 2021-12-03 MED ORDER — OXYCODONE HCL 5 MG PO TABS: 5.0000 mg | ORAL_TABLET | ORAL | 0 refills | Status: DC | PRN

## 2021-12-03 MED ORDER — METRONIDAZOLE 500 MG/100ML IV SOLN
500.0000 mg | INTRAVENOUS | Status: AC
  Administered 2021-12-03: 500 mg via INTRAVENOUS
  Filled 2021-12-03: qty 100

## 2021-12-03 MED ORDER — METOCLOPRAMIDE HCL 5 MG/ML IJ SOLN
10.0000 mg | Freq: Once | INTRAMUSCULAR | Status: AC
  Administered 2021-12-03: 10 mg via INTRAVENOUS
  Filled 2021-12-03: qty 2

## 2021-12-03 MED ORDER — OXYCODONE HCL 5 MG/5ML PO SOLN: 5.0000 mg | Freq: Once | ORAL | Status: AC | PRN

## 2021-12-03 MED ORDER — DOCUSATE SODIUM 100 MG PO CAPS: 100.0000 mg | ORAL_CAPSULE | Freq: Two times a day (BID) | ORAL | 2 refills | Status: AC

## 2021-12-03 MED ORDER — SODIUM CHLORIDE 0.9 % IV SOLN
2.0000 g | INTRAVENOUS | Status: AC
  Administered 2021-12-03: 2 g via INTRAVENOUS
  Filled 2021-12-03: qty 20

## 2021-12-03 MED ORDER — METHOCARBAMOL 750 MG PO TABS: 750.0000 mg | ORAL_TABLET | Freq: Four times a day (QID) | ORAL | 1 refills | Status: AC

## 2021-12-03 MED ORDER — HYDROMORPHONE HCL 1 MG/ML IJ SOLN
INTRAMUSCULAR | Status: AC
  Filled 2021-12-03: qty 1

## 2021-12-03 MED ORDER — METHOCARBAMOL 750 MG PO TABS: 750.0000 mg | ORAL_TABLET | Freq: Four times a day (QID) | ORAL | 1 refills | Status: DC

## 2021-12-03 MED ORDER — ONDANSETRON HCL 4 MG/2ML IJ SOLN
4.0000 mg | Freq: Once | INTRAMUSCULAR | Status: AC
  Administered 2021-12-03: 4 mg via INTRAVENOUS
  Filled 2021-12-03: qty 2

## 2021-12-03 MED ORDER — SODIUM CHLORIDE 0.9 % IR SOLN
Status: DC | PRN
  Administered 2021-12-03: 1000 mL

## 2021-12-03 MED ORDER — ONDANSETRON HCL 4 MG/2ML IJ SOLN: 4.0000 mg | Freq: Once | INTRAMUSCULAR | Status: DC | PRN

## 2021-12-03 MED ORDER — DOCUSATE SODIUM 100 MG PO CAPS: 100.0000 mg | ORAL_CAPSULE | Freq: Two times a day (BID) | ORAL | 2 refills | Status: DC

## 2021-12-03 MED ORDER — BUPIVACAINE-EPINEPHRINE (PF) 0.25% -1:200000 IJ SOLN
INTRAMUSCULAR | Status: DC | PRN
  Administered 2021-12-03: 30 mL

## 2021-12-03 MED ORDER — LACTATED RINGERS IV BOLUS
1000.0000 mL | Freq: Once | INTRAVENOUS | Status: AC
  Administered 2021-12-03: 1000 mL via INTRAVENOUS

## 2021-12-03 MED ORDER — PROPOFOL 10 MG/ML IV BOLUS
INTRAVENOUS | Status: DC | PRN
  Administered 2021-12-03: 120 mg via INTRAVENOUS

## 2021-12-03 MED ORDER — 0.9 % SODIUM CHLORIDE (POUR BTL) OPTIME
TOPICAL | Status: DC | PRN
  Administered 2021-12-03: 1000 mL

## 2021-12-03 MED ORDER — IBUPROFEN 600 MG PO TABS: 600.0000 mg | ORAL_TABLET | Freq: Four times a day (QID) | ORAL | 1 refills | Status: DC

## 2021-12-03 MED ORDER — ROCURONIUM 10MG/ML (10ML) SYRINGE FOR MEDFUSION PUMP - OPTIME
INTRAVENOUS | Status: DC | PRN
  Administered 2021-12-03: 60 mg via INTRAVENOUS

## 2021-12-03 MED ORDER — LACTATED RINGERS IV SOLN: INTRAVENOUS | Status: DC

## 2021-12-03 MED ORDER — OXYCODONE HCL 5 MG PO TABS: 5.0000 mg | ORAL_TABLET | ORAL | 0 refills | Status: AC | PRN

## 2021-12-03 MED ORDER — PROPOFOL 10 MG/ML IV BOLUS
INTRAVENOUS | Status: AC
  Filled 2021-12-03: qty 20

## 2021-12-03 MED ORDER — FENTANYL CITRATE (PF) 250 MCG/5ML IJ SOLN
INTRAMUSCULAR | Status: AC
  Filled 2021-12-03: qty 5

## 2021-12-03 MED ORDER — LIDOCAINE HCL (CARDIAC) PF 100 MG/5ML IV SOSY
PREFILLED_SYRINGE | INTRAVENOUS | Status: DC | PRN
  Administered 2021-12-03: 100 mg via INTRAVENOUS

## 2021-12-03 MED ORDER — BUPIVACAINE-EPINEPHRINE (PF) 0.25% -1:200000 IJ SOLN
INTRAMUSCULAR | Status: AC
  Filled 2021-12-03: qty 30

## 2021-12-03 MED ORDER — OXYCODONE HCL 5 MG PO TABS
ORAL_TABLET | ORAL | Status: AC
  Filled 2021-12-03: qty 1

## 2021-12-03 MED ORDER — SUGAMMADEX SODIUM 200 MG/2ML IV SOLN
INTRAVENOUS | Status: DC | PRN
  Administered 2021-12-03: 200 mg via INTRAVENOUS

## 2021-12-03 MED ORDER — IBUPROFEN 600 MG PO TABS: 600.0000 mg | ORAL_TABLET | Freq: Four times a day (QID) | ORAL | 1 refills | Status: AC

## 2021-12-03 MED ORDER — ONDANSETRON HCL 4 MG/2ML IJ SOLN
INTRAMUSCULAR | Status: DC | PRN
  Administered 2021-12-03: 4 mg via INTRAVENOUS

## 2021-12-03 MED ORDER — MIDAZOLAM HCL 2 MG/2ML IJ SOLN
INTRAMUSCULAR | Status: AC
  Filled 2021-12-03: qty 2

## 2021-12-03 SURGICAL SUPPLY — 36 items
APPLIER CLIP 5 13 M/L LIGAMAX5 (MISCELLANEOUS) ×2
BLADE CLIPPER SURG (BLADE) IMPLANT
CANISTER SUCT 3000ML PPV (MISCELLANEOUS) ×2 IMPLANT
CHLORAPREP W/TINT 26 (MISCELLANEOUS) ×2 IMPLANT
CLIP APPLIE 5 13 M/L LIGAMAX5 (MISCELLANEOUS) ×1 IMPLANT
COVER SURGICAL LIGHT HANDLE (MISCELLANEOUS) ×2 IMPLANT
DERMABOND ADVANCED (GAUZE/BANDAGES/DRESSINGS) ×1
DERMABOND ADVANCED .7 DNX12 (GAUZE/BANDAGES/DRESSINGS) ×1 IMPLANT
DISSECTOR BLUNT TIP ENDO 5MM (MISCELLANEOUS) IMPLANT
ELECT CAUTERY BLADE 6.4 (BLADE) ×2 IMPLANT
ELECT REM PT RETURN 9FT ADLT (ELECTROSURGICAL) ×2
ELECTRODE REM PT RTRN 9FT ADLT (ELECTROSURGICAL) ×1 IMPLANT
GLOVE BIO SURGEON STRL SZ 6.5 (GLOVE) ×2 IMPLANT
GLOVE BIOGEL PI IND STRL 6 (GLOVE) ×1 IMPLANT
GLOVE BIOGEL PI INDICATOR 6 (GLOVE) ×1
GOWN STRL REUS W/ TWL LRG LVL3 (GOWN DISPOSABLE) ×3 IMPLANT
GOWN STRL REUS W/TWL LRG LVL3 (GOWN DISPOSABLE) ×3
KIT BASIN OR (CUSTOM PROCEDURE TRAY) ×2 IMPLANT
KIT TURNOVER KIT B (KITS) ×2 IMPLANT
NS IRRIG 1000ML POUR BTL (IV SOLUTION) ×2 IMPLANT
PAD ARMBOARD 7.5X6 YLW CONV (MISCELLANEOUS) ×2 IMPLANT
PENCIL BUTTON HOLSTER BLD 10FT (ELECTRODE) ×2 IMPLANT
POUCH RETRIEVAL ECOSAC 10 (ENDOMECHANICALS) ×1 IMPLANT
POUCH RETRIEVAL ECOSAC 10MM (ENDOMECHANICALS) ×1
SCISSORS LAP 5X35 DISP (ENDOMECHANICALS) ×2 IMPLANT
SET IRRIG TUBING LAPAROSCOPIC (IRRIGATION / IRRIGATOR) IMPLANT
SET TUBE SMOKE EVAC HIGH FLOW (TUBING) ×2 IMPLANT
SLEEVE ENDOPATH XCEL 5M (ENDOMECHANICALS) ×4 IMPLANT
SUT MNCRL AB 4-0 PS2 18 (SUTURE) ×2 IMPLANT
SUT VIC AB 0 UR5 27 (SUTURE) IMPLANT
SUT VICRYL 0 AB UR-6 (SUTURE) IMPLANT
TOWEL GREEN STERILE FF (TOWEL DISPOSABLE) ×2 IMPLANT
TRAY LAPAROSCOPIC MC (CUSTOM PROCEDURE TRAY) ×2 IMPLANT
TROCAR XCEL BLUNT TIP 100MML (ENDOMECHANICALS) ×2 IMPLANT
TROCAR XCEL NON-BLD 5MMX100MML (ENDOMECHANICALS) ×2 IMPLANT
WATER STERILE IRR 1000ML POUR (IV SOLUTION) ×2 IMPLANT

## 2021-12-03 NOTE — Anesthesia Preprocedure Evaluation (Signed)
Anesthesia Evaluation  Patient identified by MRN, date of birth, ID band Patient awake    Reviewed: Allergy & Precautions, NPO status , Patient's Chart, lab work & pertinent test results  Airway Mallampati: II  TM Distance: >3 FB Neck ROM: Full    Dental no notable dental hx.    Pulmonary neg pulmonary ROS,    Pulmonary exam normal breath sounds clear to auscultation       Cardiovascular negative cardio ROS Normal cardiovascular exam Rhythm:Regular Rate:Normal     Neuro/Psych negative neurological ROS  negative psych ROS   GI/Hepatic negative GI ROS, Neg liver ROS,   Endo/Other  negative endocrine ROS  Renal/GU negative Renal ROS  negative genitourinary   Musculoskeletal negative musculoskeletal ROS (+)   Abdominal   Peds negative pediatric ROS (+)  Hematology  (+) Blood dyscrasia, anemia ,   Anesthesia Other Findings   Reproductive/Obstetrics negative OB ROS                             Anesthesia Physical Anesthesia Plan  ASA: 3 and emergent  Anesthesia Plan: General   Post-op Pain Management:    Induction: Intravenous  PONV Risk Score and Plan: 3 and Ondansetron, Dexamethasone, Midazolam and Treatment may vary due to age or medical condition  Airway Management Planned: Oral ETT  Additional Equipment:   Intra-op Plan:   Post-operative Plan: Extubation in OR  Informed Consent: I have reviewed the patients History and Physical, chart, labs and discussed the procedure including the risks, benefits and alternatives for the proposed anesthesia with the patient or authorized representative who has indicated his/her understanding and acceptance.     Dental advisory given  Plan Discussed with: CRNA and Surgeon  Anesthesia Plan Comments:         Anesthesia Quick Evaluation

## 2021-12-03 NOTE — Op Note (Signed)
   Operative Note  Date: 12/03/2021  Procedure: laparoscopic cholecystectomy  Pre-op diagnosis: acute cholecystitis Post-op diagnosis: same  Indication and clinical history: The patient is a 41 y.o. year old female with acute cholecystitis  Surgeon: Diamantina Monks, MD  Anesthesiologist: Okey Dupre, MD Anesthesia: General  Findings:  Specimen: gallbladder EBL: 5cc Drains/Implants: none  Disposition: PACU - hemodynamically stable.  Description of procedure: The patient was positioned supine on the operating room table. Time-out was performed verifying correct patient, procedure, signature of informed consent, and administration of pre-operative antibiotics. General anesthetic induction and intubation were uneventful. The abdomen was prepped and draped in the usual sterile fashion. An infra-umbilical incision was made using an open technique using zero vicryl stay sutures on either side of the fascia and a 61mm Hassan port inserted. After establishing pneumoperitoneum, which the patient tolerated well, the abdominal cavity was inspected and no injury of any intra-abdominal structures was identified. Additional ports were placed under direct visualization and using local anesthetic: two 64mm ports in the right subcostal region and a 89mm port in the epigastric region. The patient was re-positioned to reverse Trendelenburg and right side up. Adhesiolysis was performed to expose the gallbladder, which was then retracted cephalad. The infundibulum was identified and retracted toward the right lower quadrant. The peritoneum was incised over the infundibulum and the triangle of Calot dissected to expose the critical view of safety. With clear identification and isolation of the cystic duct and cystic artery, the cystic artery was doubly clipped and divided. After this, the cystic duct was identified as a single structure entering the gallbladder, and was also doubly clipped and divided. The gallbladder was  dissected off the liver bed using electrocautery and hemostasis of the liver bed was confirmed prior to separation of the final peritoneal attachments of the gallbladder to the liver bed.  After transection of the final peritoneal attachments, the gallbladder was placed in an endoscopic specimen retrieval bag, removed via the umbilical port site, and sent to pathology as a permanent specimen. The gallbladder fossa was inspected confirming hemostasis, the absence of bile leakage from the cystic duct stump, and correct placement of clips on the cystic artery and cystic duct stumps. The abdomen was desufflated and the fascia of the umbilical port site was closed using the previously placed stay sutures. Additional local anesthetic was administered at the umbilical port site.  The skin of all incisions was closed with 4-0 monocryl. Sterile dressings were applied. All sponge and instrument counts were correct at the conclusion of the procedure. The patient was awakened from anesthesia, extubated uneventfully, and transported to the PACU - hemodynamically stable.. There were no complications.   Upon entering the abdomen (organ space), I encountered infection of the gallbladder .  CASE DATA:  Type of patient?: DOW CASE (Surgical Hospitalist [facility])  Status of Case? URGENT Add On  Infection Present At Time Of Surgery (PATOS)?  INFECTION of the gallbladder    Diamantina Monks, MD General and Trauma Surgery [facility]

## 2021-12-03 NOTE — Anesthesia Procedure Notes (Signed)
Procedure Name: Intubation Date/Time: 12/03/2021 7:42 PM Performed by: Molli Hazard, CRNA Pre-anesthesia Checklist: Patient identified, Emergency Drugs available, Suction available and Patient being monitored Patient Re-evaluated:Patient Re-evaluated prior to induction Oxygen Delivery Method: Circle system utilized Preoxygenation: Pre-oxygenation with 100% oxygen Induction Type: IV induction Ventilation: Mask ventilation without difficulty Laryngoscope Size: Miller and 2 Grade View: Grade I Tube type: Oral Tube size: 7.0 mm Number of attempts: 1 Airway Equipment and Method: Stylet Placement Confirmation: ETT inserted through vocal cords under direct vision, positive ETCO2 and breath sounds checked- equal and bilateral Secured at: 22 cm Tube secured with: Tape Dental Injury: Teeth and Oropharynx as per pre-operative assessment

## 2021-12-03 NOTE — ED Triage Notes (Signed)
Pt states she is supposed to have gallbladder removed and has not heard from surgeon. Pt started to throwing up today and had diarrhea past 2 days.

## 2021-12-03 NOTE — ED Notes (Signed)
Last oral intake 11 AM toast

## 2021-12-03 NOTE — Transfer of Care (Signed)
Immediate Anesthesia Transfer of Care Note  Patient: Diane Owens  Procedure(s) Performed: LAPAROSCOPIC CHOLECYSTECTOMY  Patient Location: PACU  Anesthesia Type:General  Level of Consciousness: awake, oriented and sedated  Airway & Oxygen Therapy: Patient Spontanous Breathing  Post-op Assessment: Report given to RN and Post -op Vital signs reviewed and stable  Post vital signs: Reviewed and stable  Last Vitals:  Vitals Value Taken Time  BP 118/65 12/03/21 2043  Temp 98.1   Pulse 90 12/03/21 2045  Resp 18 12/03/21 2045  SpO2 94 % 12/03/21 2045  Vitals shown include unvalidated device data.  Last Pain:  Vitals:   12/03/21 1711  TempSrc: Oral  PainSc:          Complications: No notable events documented.

## 2021-12-03 NOTE — Discharge Instructions (Addendum)
[facility]  LAPAROSCOPIC SURGERY: POST OP INSTRUCTIONS Always review your discharge instruction sheet given to you by the facility where your surgery was performed. IF YOU HAVE DISABILITY OR FAMILY LEAVE FORMS, YOU MUST BRING THEM TO THE OFFICE FOR PROCESSING.   DO NOT GIVE THEM TO YOUR DOCTOR.  PAIN CONTROL  Pain regimen: take over-the-counter tylenol (acetaminophen) 1000mg every six hours, the prescription ibuprofen (600mg) every six hours and the robaxin (methocarbamol) 750mg every six hours. With all three of these, you should be taking something every two hours. Example: tylenol ( acetaminophen) at 8am, ibuprofen at 10am, robaxin (methocarbamol) at 12pm, tylenol (acetaminophen) again at 2pm, ibuprofen again at 4pm, robaxin (methocarbamol) at 6pm. You also have a prescription for oxycodone, which should be taken if the tylenol (acetaminophen), ibuprofen, and robaxin (methocarbamol) are not enough to control your pain. You may take the oxycodone as frequently as every four hours as needed, but if you are taking the other medications as above, you should not need the oxycodone this frequently. You have also been given a prescription for colace (docusate) which is a stool softener. Please take this as prescribed because the oxycodone can cause constipation and the colace (docusate) will minimize or prevent constipation. Do not drive while taking or under the influence of the oxycodone as it is a narcotic medication. Use ice packs to help control pain. If you need a refill on your pain medication, please contact your pharmacy.  They will contact our office to request authorization. Prescriptions will not be filled after 5pm or on week-ends.  HOME MEDICATIONS Take your usually prescribed medications unless otherwise directed.  DIET You should follow a light diet the first few days after arrival home.  Be sure to include lots of fluids daily.   CONSTIPATION It is common to  experience some constipation after surgery and if you are taking pain medication.  Increasing fluid intake and taking a stool softener (such as Colace) will usually help or prevent this problem from occurring.  A mild laxative (Milk of Magnesia or Miralax) should be taken according to package instructions if there are no bowel movements after 48 hours.  WOUND/INCISION CARE Most patients will experience some swelling and bruising in the area of the incisions.  Ice packs will help.  Swelling and bruising can take several days to resolve.  May shower beginning 12/02/21.  Do not peel off or scrub skin glue. May allow warm soapy water to run over incision, then rinse and pat dry.  Do not soak in any water (tubs, hot tubs, pools, lakes, oceans) for one week.   ACTIVITIES You may resume regular (light) daily activities beginning the next day--such as daily self-care, walking, climbing stairs--gradually increasing activities as tolerated.  You may have sexual intercourse when it is comfortable.   No lifting greater than 5 pounds for six weeks.  You may drive when you are no longer taking narcotic pain medication, you can comfortably wear a seatbelt, and you can safely maneuver your car and apply brakes.  FOLLOW-UP You should see your doctor in the office for a follow-up appointment approximately 2-3 weeks after your surgery.  You should have been given your post-op/follow-up appointment when your surgery was scheduled.  If you did not receive a post-op/follow-up appointment, make sure that you call for this appointment within a day or two after you arrive home to insure a convenient appointment time.  WHEN TO CALL YOUR DOCTOR: Fever over 101.5 Inability to urinate   Continued bleeding from incision. Increased pain, redness, or drainage from the incision. Increasing abdominal pain  The clinic staff is available to answer your questions during regular business hours.  Please don't hesitate to call and ask  to speak to one of the nurses for clinical concerns.  If you have a medical emergency, go to the nearest emergency room or call 911.  A surgeon from [facility] is always on call at the hospital. [address], [address], [place], [place]  [address] ? [address] [address], [place], [place]   [address] [phone] ? [phone] ? FAX [phone] Web site: [email or web address]   

## 2021-12-03 NOTE — ED Notes (Signed)
Pt ambulated to restroom with no difficulty.

## 2021-12-03 NOTE — ED Notes (Signed)
Pt to ultrasound

## 2021-12-03 NOTE — ED Provider Notes (Signed)
[facility] EMERGENCY DEPT Provider Note   CSN: [id number] Arrival date & time: 12/03/21  1217     History  Chief Complaint  Patient presents with   Abdominal Pain    Diane Owens is a 41 y.o. female.  With no significant past medical history presents to the emergency department with right upper quadrant abdominal pain.  Patient states that she was diagnosed with gallstones on 11/23/2021.  She states that she was referred to a surgeon but never heard from them.  She states that she then had a follow-up PCP appointment 4 days later and they said that they would get in touch with her referral.  She still has not heard from them.  She states that she has had unbearable abdominal pain that she describes as sharp shooting pain from the right upper quadrant over to the left upper quadrant.  She has had diarrhea for 2 days as well as alternating chills and diaphoresis.  She began having nausea and vomiting today.  States that pain is worse with eating.  Denies urinary symptoms, vaginal discharge, objective fevers, history of diabetes, chronic alcohol use   Abdominal Pain Associated symptoms: chills, diarrhea, nausea and vomiting   Associated symptoms: no dysuria, no fever and no vaginal discharge       Home Medications Prior to Admission medications   Medication Sig Start Date End Date Taking? Authorizing Provider  FLUoxetine (PROZAC) 40 MG capsule Take 40 mg by mouth daily.    [provider]  hydrOXYzine (ATARAX/VISTARIL) 10 MG tablet Take 1 tablet (10 mg total) by mouth 3 (three) times daily as needed for anxiety. 04/30/21   Ajibola, Ene A, NP  ondansetron (ZOFRAN-ODT) 4 MG disintegrating tablet Take 1 tablet (4 mg total) by mouth every 8 (eight) hours as needed for nausea or vomiting. 11/23/21   Tanda Rockers, PA-C  oxyCODONE-acetaminophen (PERCOCET/ROXICET) 5-325 MG tablet Take 1 tablet by mouth every 6 (six) hours as needed for severe pain. 11/23/21   Tanda Rockers, PA-C      Allergies    Patient has no known allergies.    Review of Systems   Review of Systems  Constitutional:  Positive for appetite change, chills and diaphoresis. Negative for fever.  Gastrointestinal:  Positive for abdominal pain, diarrhea, nausea and vomiting.  Genitourinary:  Negative for dysuria and vaginal discharge.  All other systems reviewed and are negative.  Physical Exam Updated Vital Signs BP 109/76   Pulse 74   Temp 98.1 F (36.7 C)   Resp 14   Ht 5' (1.524 m)   Wt 57.6 kg   LMP 12/03/2021 (Exact Date)   SpO2 100%   BMI 24.80 kg/m  Physical Exam Vitals and nursing note reviewed.  Constitutional:      Appearance: Normal appearance. She is ill-appearing.  HENT:     Head: Normocephalic.  Eyes:     General: No scleral icterus.    Pupils: Pupils are equal, round, and reactive to light.  Cardiovascular:     Rate and Rhythm: Normal rate and regular rhythm.     Heart sounds: Normal heart sounds. No murmur heard. Pulmonary:     Effort: Pulmonary effort is normal. No respiratory distress.     Breath sounds: Normal breath sounds.  Abdominal:     General: Abdomen is protuberant. Bowel sounds are normal. There is no distension.     Palpations: Abdomen is soft.     Tenderness: There is abdominal tenderness in the right upper quadrant and  epigastric area. There is guarding. There is no right CVA tenderness, left CVA tenderness or rebound. Positive signs include Murphy's sign. Negative signs include McBurney's sign.     Hernia: No hernia is present.  Musculoskeletal:        General: Normal range of motion.     Cervical back: Neck supple.  Skin:    General: Skin is warm and dry.     Capillary Refill: Capillary refill takes less than 2 seconds.     Coloration: Skin is not jaundiced.  Neurological:     General: No focal deficit present.     Mental Status: She is alert and oriented to person, place, and time. Mental status is at baseline.  Psychiatric:         Mood and Affect: Mood normal.        Behavior: Behavior normal.        Thought Content: Thought content normal.        Judgment: Judgment normal.    ED Results / Procedures / Treatments   Labs (all labs ordered are listed, but only abnormal results are displayed) Labs Reviewed  COMPREHENSIVE METABOLIC PANEL - Abnormal; Notable for the following components:      Result Value   CO2 20 (*)    Glucose, Bld 110 (*)    Calcium 8.5 (*)    Total Bilirubin 0.2 (*)    All other components within normal limits  CBC - Abnormal; Notable for the following components:   Hemoglobin 8.0 (*)    HCT 28.2 (*)    MCV 68.6 (*)    MCH 19.5 (*)    MCHC 28.4 (*)    RDW 21.1 (*)    All other components within normal limits  URINALYSIS, ROUTINE W REFLEX MICROSCOPIC - Abnormal; Notable for the following components:   Color, Urine BROWN (*)    Specific Gravity, Urine <1.005 (*)    Hgb urine dipstick LARGE (*)    Protein, ur 30 (*)    RBC / HPF >50 (*)    All other components within normal limits  LIPASE, BLOOD  PREGNANCY, URINE   EKG None  Radiology US Abdomen Limited RUQ (LIVER/GB)  Result Date: 12/03/2021 CLINICAL DATA:  Right upper quadrant pain EXAM: ULTRASOUND ABDOMEN LIMITED RIGHT UPPER QUADRANT COMPARISON:  None Available. FINDINGS: Gallbladder: There are a few mobile shadowing stones within the gallbladder measuring up to 1.2 cm in diameter. Gallbladder is mildly dilated. Gallbladder wall thickness of 2.2 mm, within normal limits. Positive Eulah Pont sign was indicated by the sonographer. Common bile duct: Diameter: 2 mm. Liver: No focal lesion identified. Within normal limits in parenchymal echogenicity. Portal vein is patent on color Doppler imaging with normal direction of blood flow towards the liver. Other: None. IMPRESSION: Cholelithiasis with a positive sonographic Murphy sign. Findings are concerning for acute cholecystitis. Electronically Signed   By: Duanne Guess D.O.   On:  12/03/2021 15:49    Procedures Procedures    Medications Ordered in ED Medications  lactated ringers infusion (has no administration in time range)  HYDROmorphone (DILAUDID) injection 1 mg (has no administration in time range)  lactated ringers bolus 1,000 mL (1,000 mLs Intravenous New Bag/Given 12/03/21 1555)  ondansetron (ZOFRAN) injection 4 mg (4 mg Intravenous Given 12/03/21 1452)  morphine (PF) 4 MG/ML injection 4 mg (4 mg Intravenous Given 12/03/21 1455)  metoCLOPramide (REGLAN) injection 10 mg (10 mg Intravenous Given 12/03/21 1710)    ED Course/ Medical Decision Making/ A&P  Medical Decision Making Amount and/or Complexity of Data Reviewed Labs: ordered. Radiology: ordered.  Risk Prescription drug management. Decision regarding hospitalization.   This patient presents to the ED for concern of right upper quadrant abdominal pain, this involves an extensive number of treatment options, and is a complaint that carries with it a high risk of complications and morbidity.  The differential diagnosis includes cholelithiasis, cholecystitis, choledocholithiasis, hepatitis, pneumonia, colitis, PUD or gastritis, etc.  Co morbidities that complicate the patient evaluation Cholelithiasis  Additional history obtained:  Additional history obtained from: Husband at bedside External records from outside source obtained and reviewed including: ED physician note from 11/23/2021  EKG: Not indicated  Cardiac Monitoring: Not indicated  Lab Results: I personally ordered, reviewed, and interpreted labs. Pertinent results include: CMP with mild metabolic acidosis with CO2 of 20, may be from vomiting CBC without leukocytosis, hemoglobin 8, which appears to be stable from 5/19 UA with blood Lipase negative Not pregnant  Imaging Studies ordered:  I ordered imaging studies which included ultrasound.  I independently reviewed & interpreted imaging & am in agreement  with radiology impression. Imaging shows: Right upper quadrant ultrasound with cholelithiasis and positive Murphy sign concerning for acute cholecystitis  Medications  I ordered medication including LR bolus followed by infusion for preop and rehydration.  Morphine and Dilaudid for pain, Zofran and Reglan for nausea Reevaluation of the patient after medication shows that patient improved -I reviewed the patient's home medications and did not make adjustments. -I did not prescribe new home medications.  Tests Considered: CT abdomen pelvis with contrast, however feel that this is likely localized right upper quadrant pain secondary to cholelithiasis and ultrasound imaging is modality of choice  Critical Interventions: General surgery consult  Consultations: I requested consultation with the general surgeon, Dr. Bedelia Person,  and discussed lab and imaging findings as well as pertinent plan - they recommend: Transfer to [facility] preop  SDH None identified  ED Course:  41 year old female who presents to the emergency department with right upper quadrant abdominal pain. Physical exam is notable for Murphy sign.  The patient is obviously uncomfortable on exam.  She is almost in a tripod position and splinting herself on a pillow secondary to pain.  She is tender on light palpation to the right upper quadrant.  There is no CVA tenderness.  The abdomen is soft.  There are bowel sounds.  No evidence of an acute bowel ischemia or rigid abdomen at this time. Labs with hemoglobin of 8, this appears similar to previous. No transaminitis or elevated bilirubin.  No elevated lipase concerning for choledocholithiasis or pancreatitis. Urine without UA.  Doubt pyelonephritis.  No respiratory symptoms concerning for pneumonia or PE and referred pain at the right upper quadrant. She already had a CT on 5/19 for her right upper quadrant pain which demonstrated her cholelithiasis.  We will obtain an ultrasound to  reevaluate. In the interim, she was given morphine, Dilaudid, Zofran, Reglan and fluids which has improved her symptoms. Her ultrasound does show cholelithiasis.  She has Eulah Pont sign concerning for acute cholecystitis on their exam. I have consulted and spoken with the general surgeon, Dr. Bedelia Person.  She is recommended that the patient be transferred from [place] to [facility] preop for a cholecystectomy.  I have called [facility] and coordinated this transfer.  The patient has been updated and she is agreeable to this plan.  We will keep the patient comfortable while she is pending transfer.   Doubt other etiologies  of her abdominal pain at this time including PUD, gastritis, bowel perforation, bowel obstruction, colitis, pancreatitis, hepatitis, pneumonia, PE. After consideration of the diagnostic results and the patients response to treatment, I feel that the patent would benefit from transfer to OR at [facility]. The patient has been appropriately medically screened and/or stabilized in the ED. I have low suspicion for any other emergent medical condition which would require further screening, evaluation or treatment in the ED or require inpatient management.  Final Clinical Impression(s) / ED Diagnoses Final diagnoses:  Acute cholecystitis    Rx / DC Orders ED Discharge Orders     None         Cristopher Peruutry, Reynard Christoffersen E, PA-C 12/03/21 1723    Terald Sleeperrifan, Matthew J, MD 12/04/21 [phone]

## 2021-12-03 NOTE — ED Notes (Signed)
Pt via pov from home with abdominal pain (x 2 weeks), diarrhea(x 2 days), emesis (today). Pt reports being seen and told that she needed her gallbladder removed but that she would be referred to the surgeon. She has not heard from surgeon's office and has tried to call them without being able to resolve it. Pt states, "I know what is wrong. I want it out." Pt alert & oriented, NAD noted.

## 2021-12-03 NOTE — H&P (Addendum)
Reason for Consult/Chief Complaint:acute cholecystitis Consultant: Cherlynn Polo, [place]  Diane Owens is an 41 y.o. female.   HPI: 9F with 3d h/o RUQ abdominal pain, n/v, diarrhea, subjective fever, and chills. Dx with symptomatic cholelithiasis a few weeks ago and unable to get an appointment at [facility].   H/o ruptured ovarian cyst s/p exploration, appendectomy, and pelvic mesh placement. Most recent surgery was in 2006, lap appy.   History reviewed. No pertinent past medical history.  History reviewed. No pertinent surgical history.  No family history on file.  Social History:  has no history on file for tobacco use, alcohol use, and drug use.  Allergies: No Known Allergies  Medications: I have reviewed the patient's current medications.  Results for orders placed or performed during the hospital encounter of 12/03/21 (from the past 48 hour(s))  Lipase, blood     Status: None   Collection Time: 12/03/21 12:38 PM  Result Value Ref Range   Lipase 15 11 - 51 U/L    Comment: Performed at [facility], [address], [place], [place] [address]  Comprehensive metabolic panel     Status: Abnormal   Collection Time: 12/03/21 12:38 PM  Result Value Ref Range   Sodium 140 135 - 145 mmol/L   Potassium 3.9 3.5 - 5.1 mmol/L   Chloride 111 98 - 111 mmol/L   CO2 20 (L) 22 - 32 mmol/L   Glucose, Bld 110 (H) 70 - 99 mg/dL    Comment: Glucose reference range applies only to samples taken after fasting for at least 8 hours.   BUN 10 6 - 20 mg/dL   Creatinine, Ser 6.72 0.44 - 1.00 mg/dL   Calcium 8.5 (L) 8.9 - 10.3 mg/dL   Total Protein 7.0 6.5 - 8.1 g/dL   Albumin 4.2 3.5 - 5.0 g/dL   AST 16 15 - 41 U/L   ALT 15 0 - 44 U/L   Alkaline Phosphatase 45 38 - 126 U/L   Total Bilirubin 0.2 (L) 0.3 - 1.2 mg/dL   GFR, Estimated >09 >47 mL/min    Comment: (NOTE) Calculated using the CKD-EPI Creatinine Equation (2021)    Anion gap 9 5 - 15    Comment: Performed at [facility], [address], [place], [place] [address]  CBC     Status: Abnormal   Collection Time: 12/03/21 12:38 PM  Result Value Ref Range   WBC 6.2 4.0 - 10.5 K/uL   RBC 4.11 3.87 - 5.11 MIL/uL   Hemoglobin 8.0 (L) 12.0 - 15.0 g/dL    Comment: Reticulocyte Hemoglobin testing may be clinically indicated, consider ordering this additional test [id number]    HCT 28.2 (L) 36.0 - 46.0 %   MCV 68.6 (L) 80.0 - 100.0 fL   MCH 19.5 (L) 26.0 - 34.0 pg   MCHC 28.4 (L) 30.0 - 36.0 g/dL   RDW 54.6 (H) 50.3 - 54.6 %   Platelets 284 150 - 400 K/uL   nRBC 0.0 0.0 - 0.2 %    Comment: Performed at [facility], [address] [place], [place] [address]  Urinalysis, Routine w reflex microscopic Urine, Clean Catch     Status: Abnormal   Collection Time: 12/03/21 12:38 PM  Result Value Ref Range   Color, Urine BROWN (A) YELLOW    Comment: BIOCHEMICALS MAY BE AFFECTED BY COLOR   APPearance CLEAR CLEAR   Specific Gravity, Urine <1.005 (L) 1.005 - 1.030   pH 7.0 5.0 - 8.0  Glucose, UA NEGATIVE NEGATIVE mg/dL   Hgb urine dipstick LARGE (A) NEGATIVE   Bilirubin Urine NEGATIVE NEGATIVE   Ketones, ur NEGATIVE NEGATIVE mg/dL   Protein, ur 30 (A) NEGATIVE mg/dL   Nitrite NEGATIVE NEGATIVE   Leukocytes,Ua NEGATIVE NEGATIVE   RBC / HPF >50 (H) 0 - 5 RBC/hpf   WBC, UA 11-20 0 - 5 WBC/hpf   Squamous Epithelial / LPF 0-5 0 - 5    Comment: Performed at [facility], [address] [place], [place] [address]  Pregnancy, urine     Status: None   Collection Time: 12/03/21 12:38 PM  Result Value Ref Range   Preg Test, Ur NEGATIVE NEGATIVE    Comment:        THE SENSITIVITY OF THIS METHODOLOGY IS >20 mIU/mL. Performed at [facility], [address], [place], [place] [address]     US Abdomen Limited RUQ (LIVER/GB)  Result Date: 12/03/2021 CLINICAL DATA:  Right upper quadrant pain EXAM: ULTRASOUND ABDOMEN LIMITED RIGHT UPPER  QUADRANT COMPARISON:  None Available. FINDINGS: Gallbladder: There are a few mobile shadowing stones within the gallbladder measuring up to 1.2 cm in diameter. Gallbladder is mildly dilated. Gallbladder wall thickness of 2.2 mm, within normal limits. Positive Eulah Pont sign was indicated by the sonographer. Common bile duct: Diameter: 2 mm. Liver: No focal lesion identified. Within normal limits in parenchymal echogenicity. Portal vein is patent on color Doppler imaging with normal direction of blood flow towards the liver. Other: None. IMPRESSION: Cholelithiasis with a positive sonographic Murphy sign. Findings are concerning for acute cholecystitis. Electronically Signed   By: Duanne Guess D.O.   On: 12/03/2021 15:49    ROS 10 point review of systems is negative except as listed above in HPI.   Physical Exam Blood pressure 104/70, pulse 68, temperature 98.3 F (36.8 C), temperature source Oral, resp. rate 14, height 5' (1.524 m), weight 57.6 kg, last menstrual period 12/03/2021, SpO2 99 %. Constitutional: well-developed, well-nourished HEENT: pupils equal, round, reactive to light, 69mm b/l, moist conjunctiva, external inspection of ears and nose normal, hearing intact Oropharynx: normal oropharyngeal mucosa, normal dentition Neck: no thyromegaly, trachea midline, no midline cervical tenderness to palpation Chest: breath sounds equal bilaterally, normal respiratory effort, no midline or lateral chest wall tenderness to palpation/deformity Abdomen: soft, RUQ TTP, no bruising, no hepatosplenomegaly GU: normal female genitalia  Back: no wounds, no thoracic/lumbar spine tenderness to palpation, no thoracic/lumbar spine stepoffs Rectal: deferred Extremities: 2+ radial and pedal pulses bilaterally, intact motor and sensation bilateral UE and LE, no peripheral edema MSK: normal gait/station, no clubbing/cyanosis of fingers/toes, normal ROM of all four extremities Skin: warm, dry, no rashes Psych:  normal memory, normal mood/affect     Assessment/Plan: 61F with acute cholecystitis. Plan for lap chole. Informed consent was obtained after detailed explanation of risks, including bleeding, infection, biloma, hematoma, injury to common bile duct, need for IOC to delineate anatomy, and need for conversion to open procedure. All questions answered to the patient's satisfaction.   Diamantina Monks, MD General and Trauma Surgery [facility]

## 2021-12-04 ENCOUNTER — Encounter (HOSPITAL_COMMUNITY): Payer: Self-pay | Admitting: Surgery

## 2021-12-05 LAB — SURGICAL PATHOLOGY

## 2021-12-05 NOTE — Anesthesia Postprocedure Evaluation (Signed)
Anesthesia Post Note  Patient: Diane Owens  Procedure(s) Performed: LAPAROSCOPIC CHOLECYSTECTOMY     Patient location during evaluation: PACU Anesthesia Type: General Level of consciousness: awake and alert Pain management: pain level controlled Vital Signs Assessment: post-procedure vital signs reviewed and stable Respiratory status: spontaneous breathing, nonlabored ventilation, respiratory function stable and patient connected to nasal cannula oxygen Cardiovascular status: blood pressure returned to baseline and stable Postop Assessment: no apparent nausea or vomiting Anesthetic complications: no   No notable events documented.  Last Vitals:  Vitals:   12/03/21 2130 12/03/21 2145  BP: 110/66 106/67  Pulse: 95 95  Resp: 19 19  Temp:    SpO2: 96% 96%    Last Pain:  Vitals:   12/03/21 2130  TempSrc:   PainSc: 4                  Trung Wenzl S

## 2021-12-10 ENCOUNTER — Other Ambulatory Visit: Payer: Self-pay | Admitting: Surgery

## 2021-12-10 ENCOUNTER — Ambulatory Visit: Payer: Self-pay | Admitting: Surgery

## 2021-12-10 ENCOUNTER — Other Ambulatory Visit (HOSPITAL_COMMUNITY): Payer: Self-pay | Admitting: Surgery

## 2021-12-10 ENCOUNTER — Ambulatory Visit (HOSPITAL_COMMUNITY)
Admission: RE | Admit: 2021-12-10 | Discharge: 2021-12-10 | Disposition: A | Discharge status: Home / Self Care | Payer: Managed Care, Other (non HMO) | Source: Ambulatory Visit | Attending: Surgery | Admitting: Surgery

## 2021-12-10 DIAGNOSIS — T8189XA Other complications of procedures, not elsewhere classified, initial encounter: Secondary | ICD-10-CM | POA: Diagnosis not present

## 2021-12-10 DIAGNOSIS — K668 Other specified disorders of peritoneum: Secondary | ICD-10-CM

## 2021-12-10 MED ORDER — IOHEXOL 300 MG/ML  SOLN
100.0000 mL | Freq: Once | INTRAMUSCULAR | Status: AC | PRN
  Administered 2021-12-10: 80 mL via INTRAVENOUS

## 2023-05-29 IMAGING — CT CT ABD-PELV W/ CM
2 of 5 series · 14 of 46 positions shown, 16 images · IV contrast (OMNIPAQUE)
Comparison: No priors.

CLINICAL DATA: 41-year-old female with history of abdominal pain.
Suspected bowel obstruction.

EXAM:
CT ABDOMEN AND PELVIS WITH CONTRAST
TECHNIQUE: Multidetector CT imaging of the abdomen and pelvis was performed
using the standard protocol following bolus administration of
intravenous contrast.

[Series 2: axial st · axial · 0.71mm/px · z∈[+1134,+1524]mm · 11 of 92 slices shown, 13 images]
[im 7/92  soft-tissue]
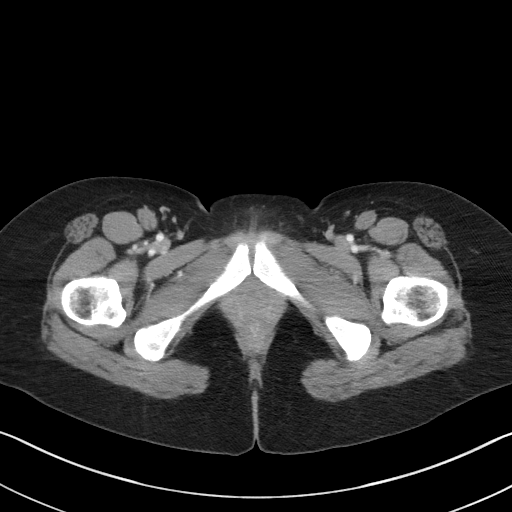
[im 7/92  bone]
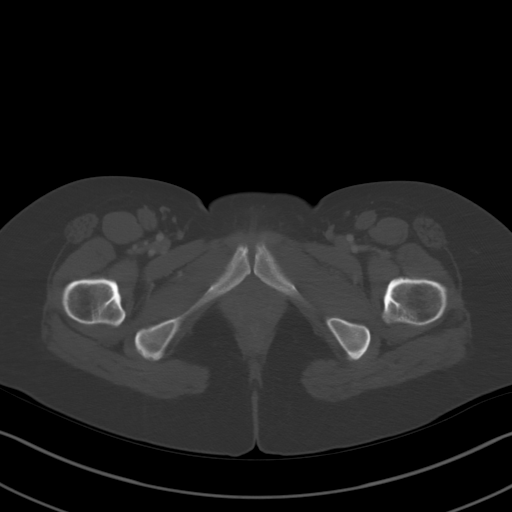
[im 14/92  soft-tissue]
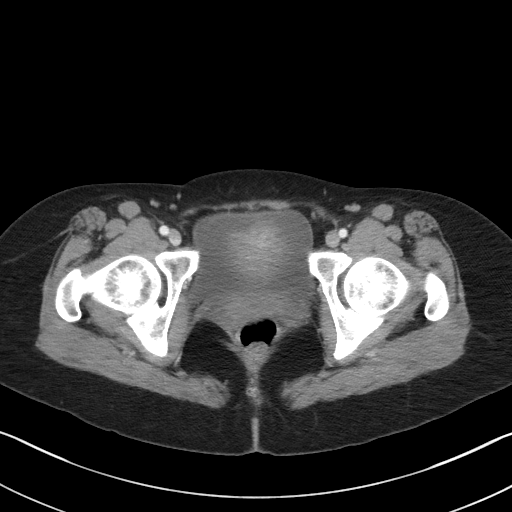
[im 20/92  soft-tissue]
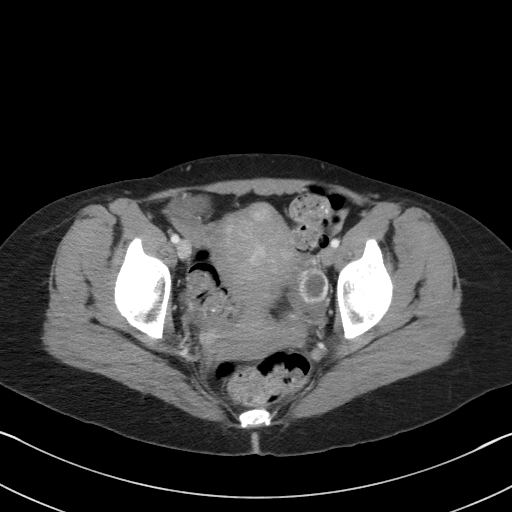
[im 33/92  soft-tissue]
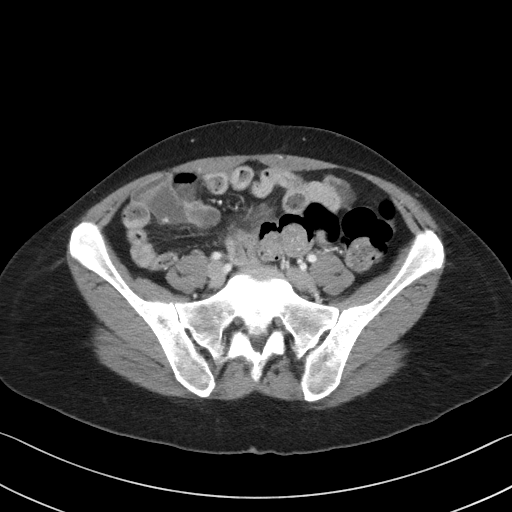
[im 40/92  soft-tissue]
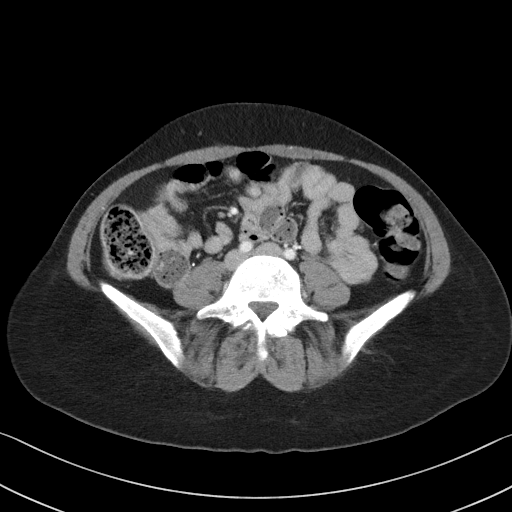
[im 46/92  soft-tissue]
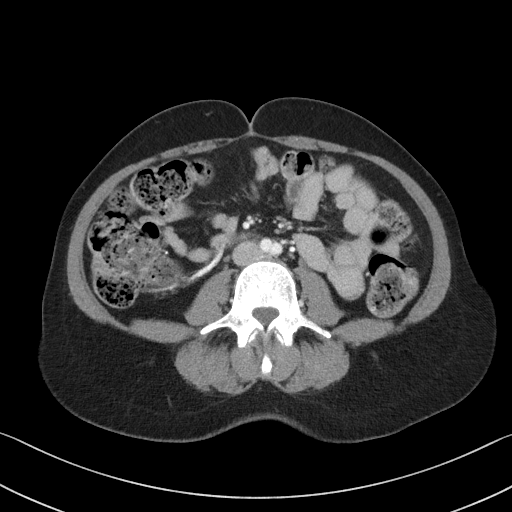
[im 53/92  soft-tissue]
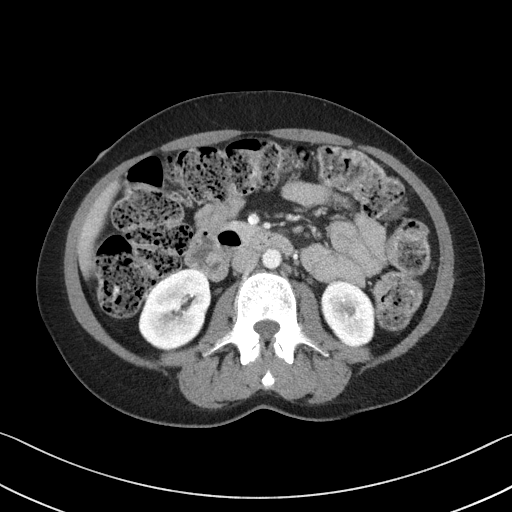
[im 59/92  soft-tissue]
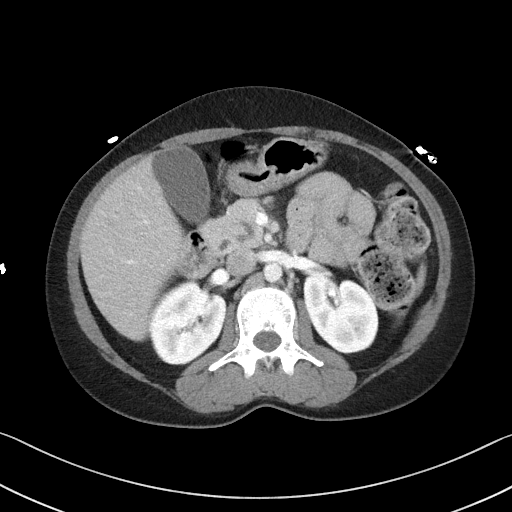
[im 72/92  soft-tissue]
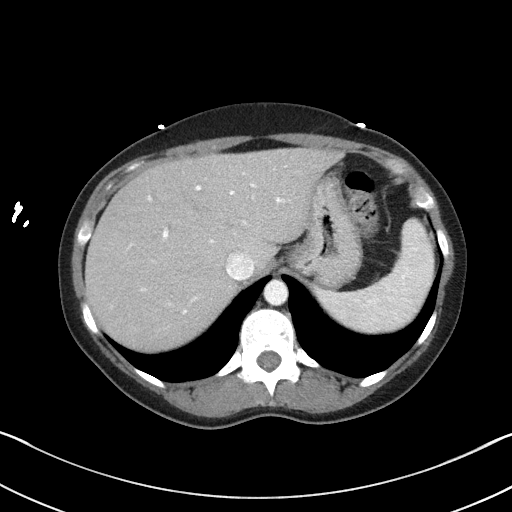
[im 72/92  bone]
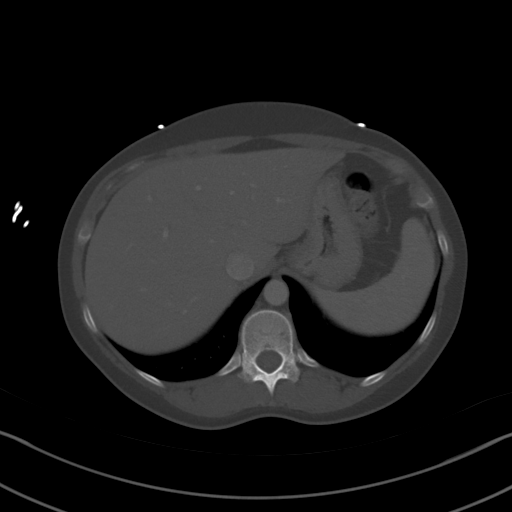
[im 79/92  soft-tissue]
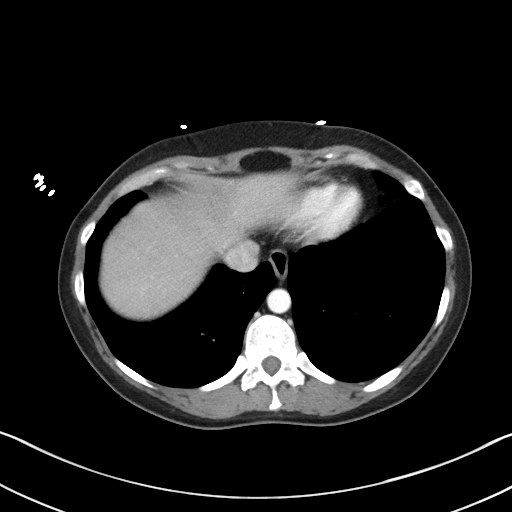
[im 85/92  soft-tissue]
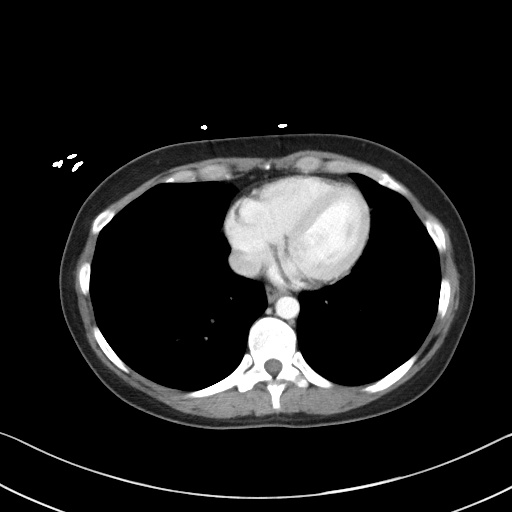

[Series 5: coronal st · coronal · 0.55mm/px · 3 of 81 slices shown]
[im 27/81  soft-tissue]
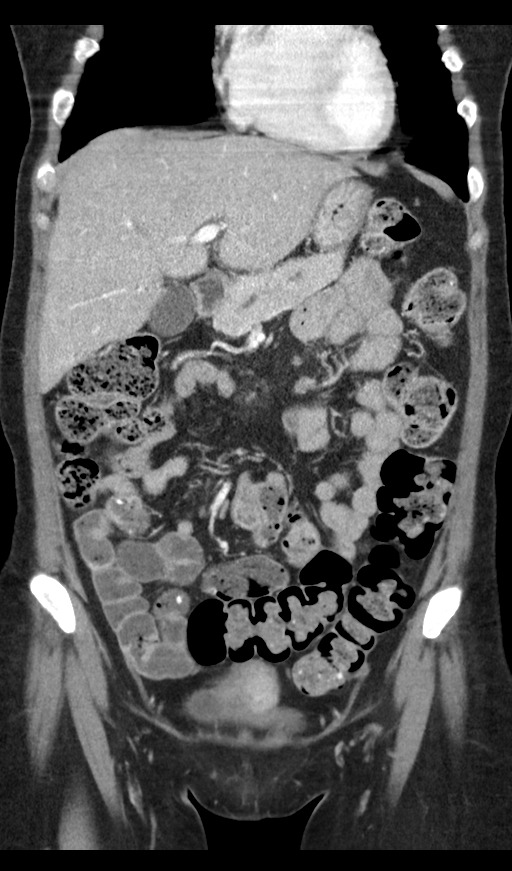
[im 36/81  soft-tissue]
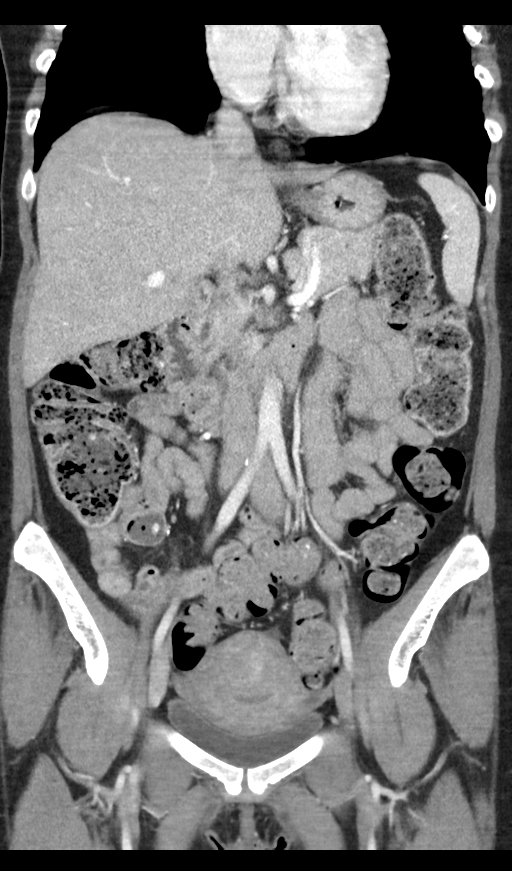
[im 45/81  soft-tissue]
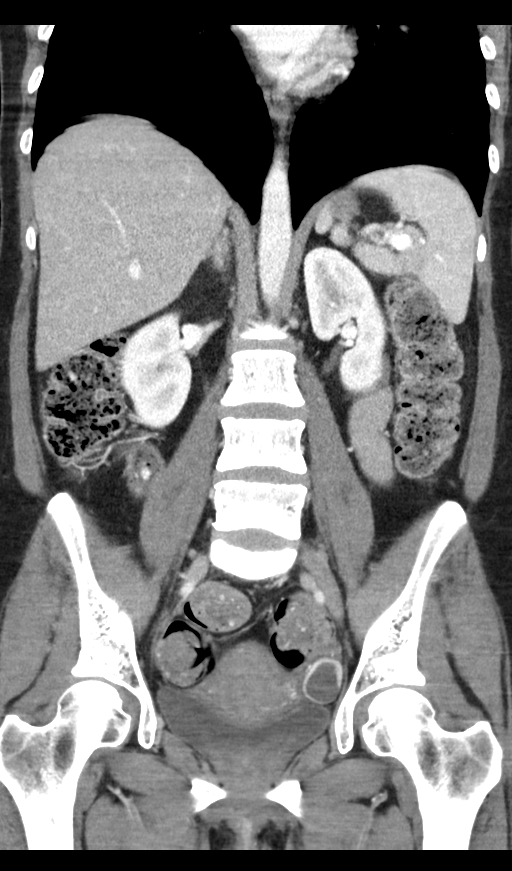

[14 of 46 positions shown; findings below may reference images not displayed]

RADIATION DOSE REDUCTION: This exam was performed according to the
departmental dose-optimization program which includes automated
exposure control, adjustment of the mA and/or kV according to
patient size and/or use of iterative reconstruction technique.

CONTRAST:  100mL OMNIPAQUE IOHEXOL 300 MG/ML  SOLN
FINDINGS: Lower chest: Unremarkable.

Hepatobiliary: No suspicious cystic or solid hepatic lesions. No
intra or extrahepatic biliary ductal dilatation. Small calcified
gallstones lie dependently near the neck of the gallbladder.
Gallbladder is only mildly distended. Gallbladder wall thickness is
normal. No pericholecystic fluid or surrounding inflammatory
changes.

Pancreas: No pancreatic mass. No pancreatic ductal dilatation. No
pancreatic or peripancreatic fluid collections or inflammatory
changes.

Spleen: Unremarkable.

Adrenals/Urinary Tract: Bilateral kidneys and adrenal glands are
normal in appearance. No hydroureteronephrosis. Urinary bladder is
normal in appearance.

Stomach/Bowel: The appearance of the stomach is normal. There is no
pathologic dilatation of small bowel or colon. The appendix is not
confidently identified and may be surgically absent. Regardless,
there are no inflammatory changes noted adjacent to the cecum to
suggest the presence of an acute appendicitis at this time.

Vascular/Lymphatic: No significant atherosclerotic disease, aneurysm
or dissection noted in the abdominal or pelvic vasculature. No
lymphadenopathy noted in the abdomen or pelvis.

Reproductive: Uterus is heterogeneous in appearance with several
small lesions, largest of which is in the posterior aspect of the
uterine fundus measuring approximately 2.4 cm in diameter,
presumably multiple small fibroids. Right ovary is unremarkable in
appearance. In the left adnexa there is a 2.4 cm centrally
low-attenuation peripherally rim enhancing structure, most
compatible with a degenerating corpus luteum cyst.

Other: No significant volume of ascites.  No pneumoperitoneum.

Musculoskeletal: There are no aggressive appearing lytic or blastic
lesions noted in the visualized portions of the skeleton.
IMPRESSION: 1. Cholelithiasis without evidence of acute cholecystitis at this
time.
2. Probable degenerating corpus luteum cyst in the left ovary.
3. No other potential acute findings are noted on today's
examination to account for the patient's symptoms.

## 2023-06-08 IMAGING — US US ABDOMEN LIMITED
1 series · 14 of 25 positions shown · non-contrast
Comparison: None Available.

CLINICAL DATA: Right upper quadrant pain

EXAM:
ULTRASOUND ABDOMEN LIMITED RIGHT UPPER QUADRANT

[Series 1: us abdomen limited ruq (liver/gb) · 14 of 39 slices shown]
[im 1/39]
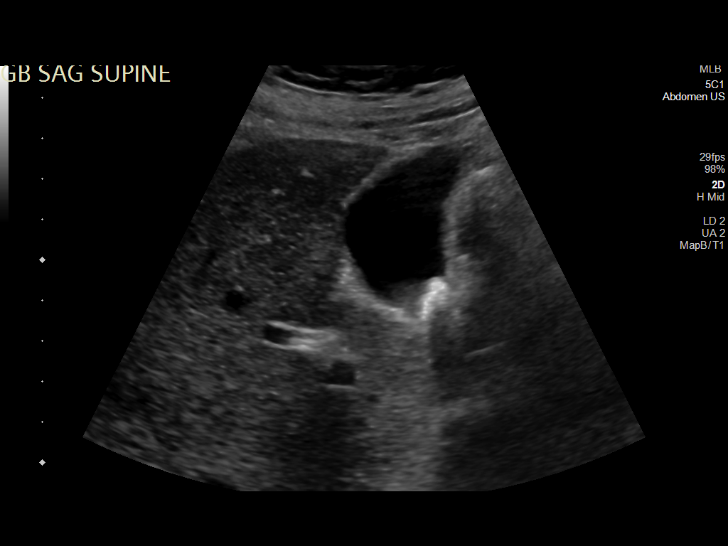
[im 4/39]
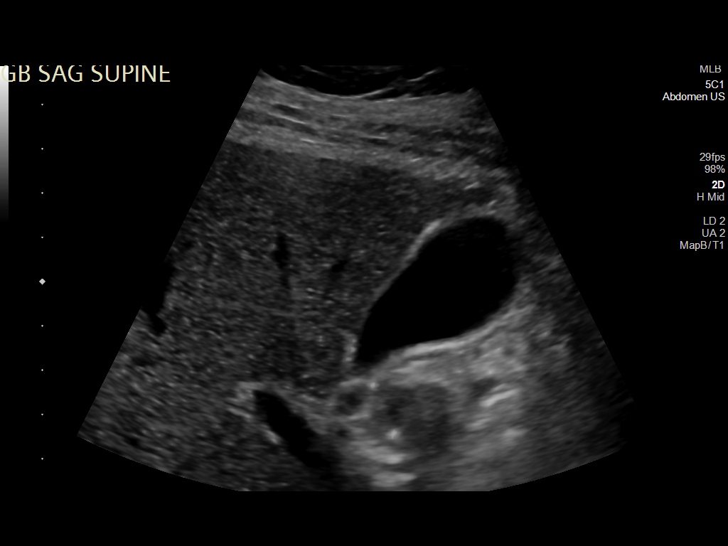
[im 7/39]
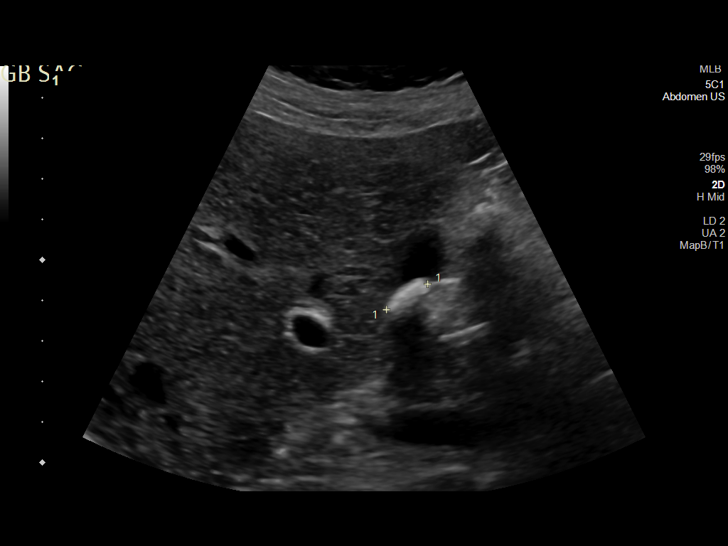
[im 10/39]
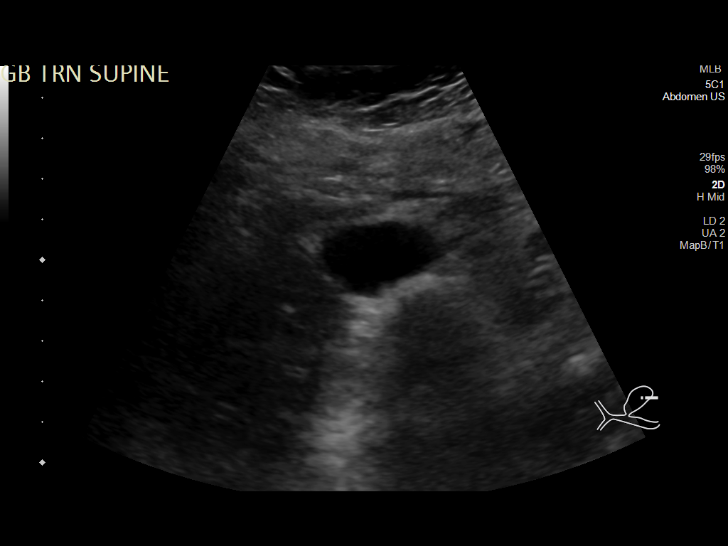
[im 13/39]
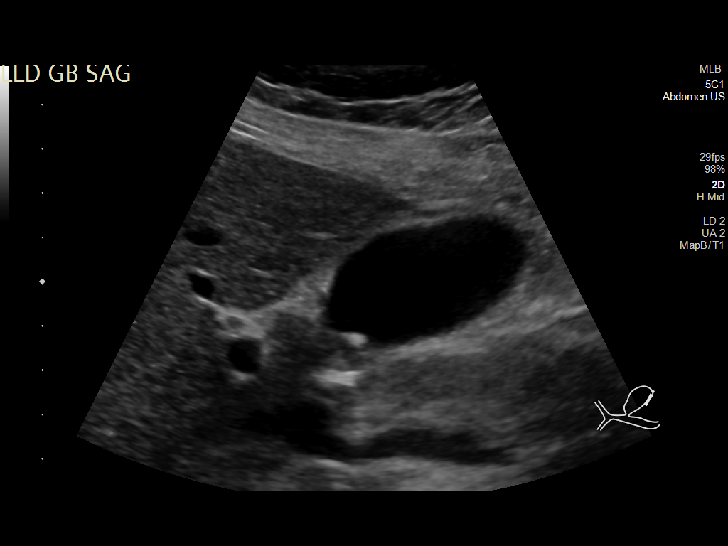
[im 15/39]
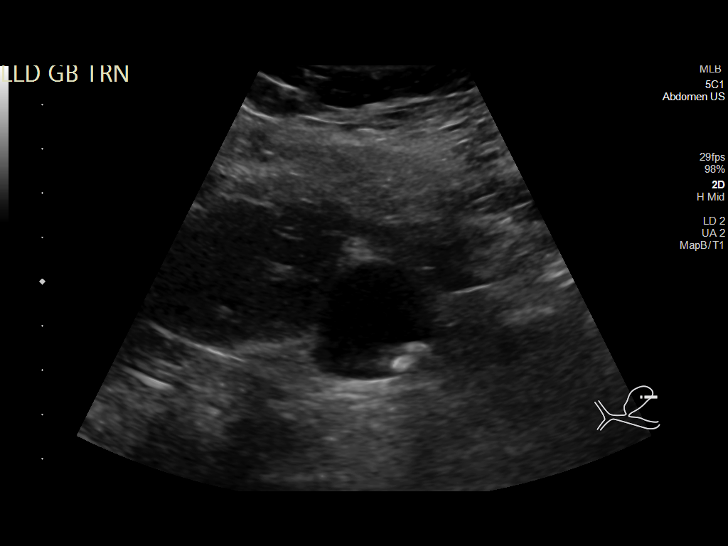
[im 18/39]
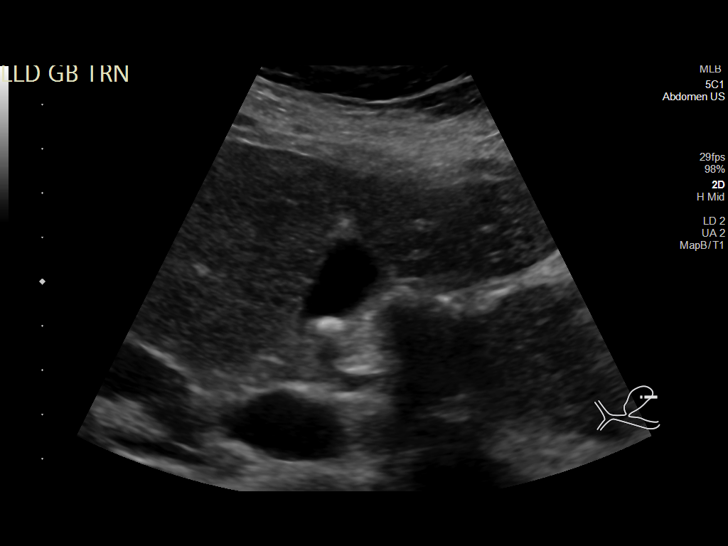
[im 21/39]
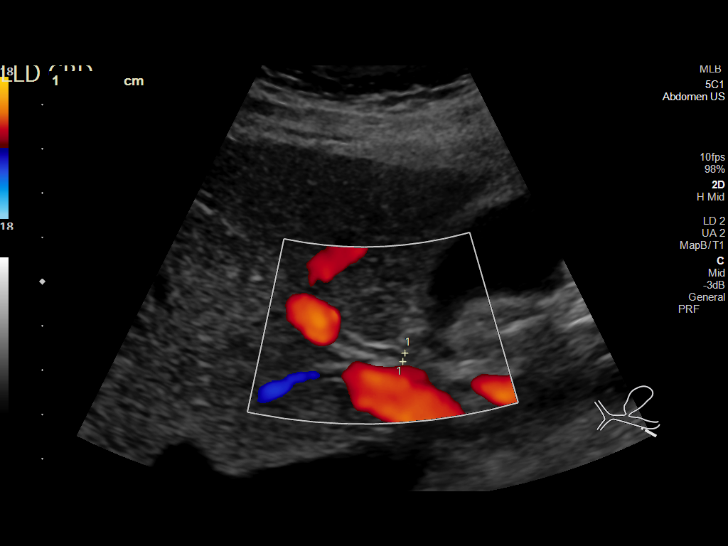
[im 24/39]
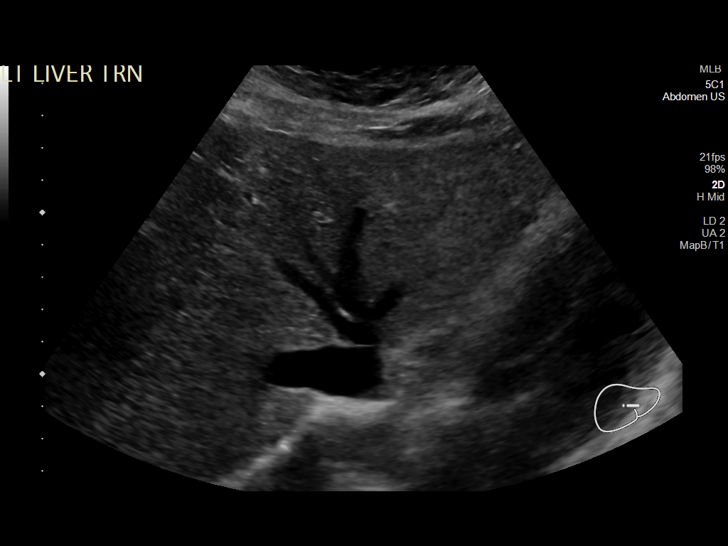
[im 26/39]
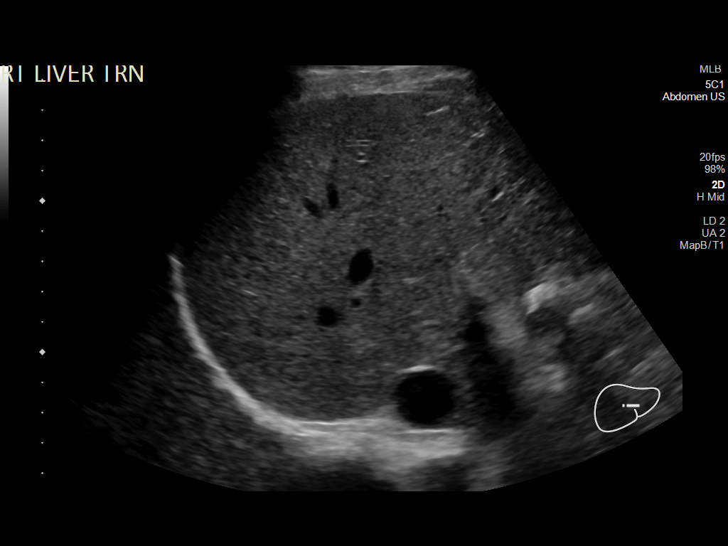
[im 29/39]
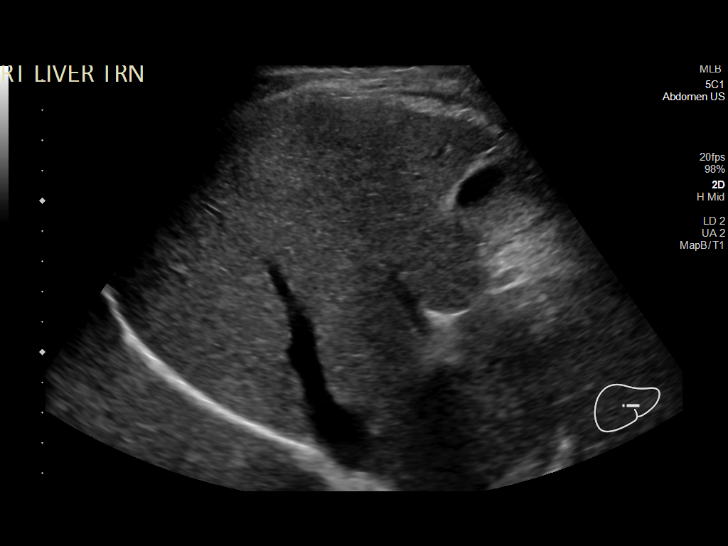
[im 32/39]
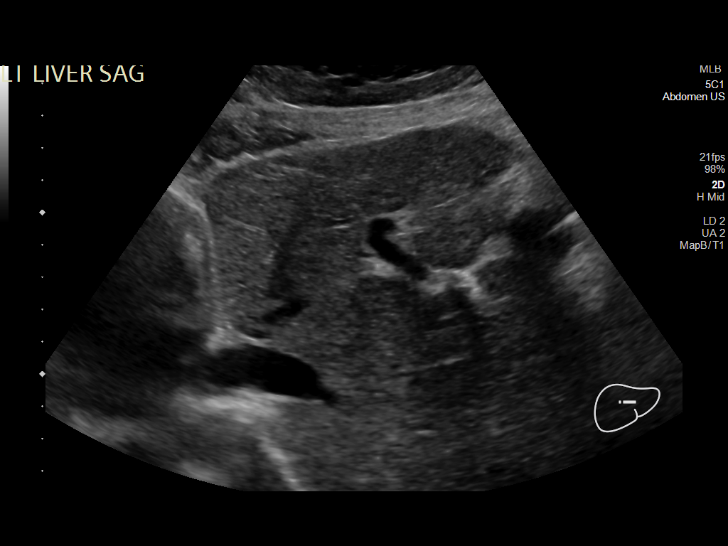
[im 35/39]
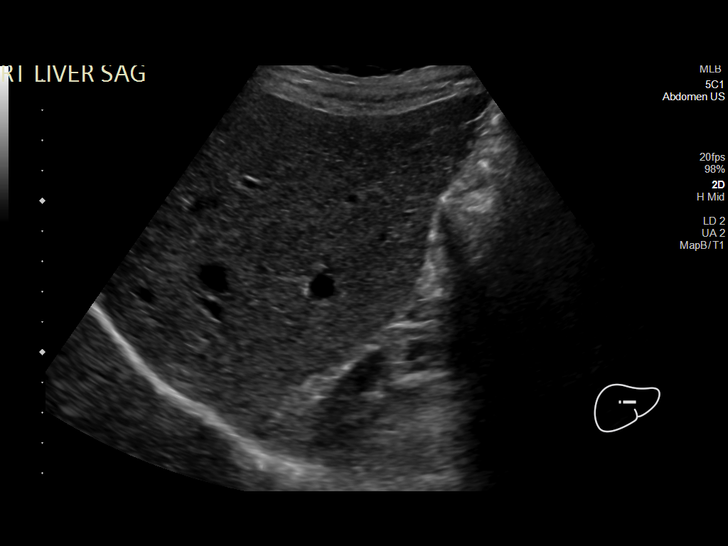
[im 39/39]
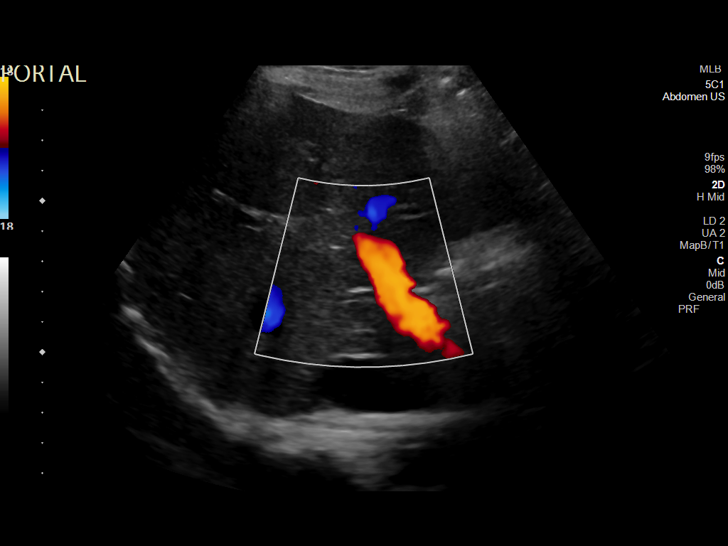

[14 of 25 positions shown; findings below may reference images not displayed]

FINDINGS: Gallbladder:

There are a few mobile shadowing stones within the gallbladder
measuring up to 1.2 cm in diameter. Gallbladder is mildly dilated.
Gallbladder wall thickness of 2.2 mm, within normal limits. Positive
Murphy sign was indicated by the sonographer.

Common bile duct:

Diameter: 2 mm.

Liver:

No focal lesion identified. Within normal limits in parenchymal
echogenicity. Portal vein is patent on color Doppler imaging with
normal direction of blood flow towards the liver.

Other: None.
IMPRESSION: Cholelithiasis with a positive sonographic Murphy sign. Findings are
concerning for acute cholecystitis.
